# Patient Record
Sex: Female | Born: 1960 | Race: Black or African American | Hispanic: No | Marital: Married | Smoking: Never smoker
Health system: Southern US, Community
[De-identification: ages and names within clinical notes are randomized; demographics above are authoritative.]

## PROBLEM LIST (undated history)

## (undated) DIAGNOSIS — I10 Essential (primary) hypertension: Secondary | ICD-10-CM

## (undated) DIAGNOSIS — D509 Iron deficiency anemia, unspecified: Secondary | ICD-10-CM

## (undated) DIAGNOSIS — N921 Excessive and frequent menstruation with irregular cycle: Secondary | ICD-10-CM

## (undated) DIAGNOSIS — E039 Hypothyroidism, unspecified: Secondary | ICD-10-CM

## (undated) DIAGNOSIS — D259 Leiomyoma of uterus, unspecified: Secondary | ICD-10-CM

## (undated) HISTORY — DX: Hypothyroidism, unspecified: E03.9

## (undated) HISTORY — DX: Iron deficiency anemia, unspecified: D50.9

## (undated) HISTORY — DX: Essential (primary) hypertension: I10

## (undated) HISTORY — DX: Leiomyoma of uterus, unspecified: D25.9

## (undated) HISTORY — PX: OTHER SURGICAL HISTORY: SHX169

## (undated) HISTORY — DX: Excessive and frequent menstruation with irregular cycle: N92.1

---

## 1999-10-28 ENCOUNTER — Emergency Department (HOSPITAL_COMMUNITY): Admission: EM | Admit: 1999-10-28 | Discharge: 1999-10-29 | Payer: Self-pay | Admitting: Emergency Medicine

## 2001-05-31 ENCOUNTER — Encounter: Payer: Self-pay | Admitting: Family Medicine

## 2001-05-31 ENCOUNTER — Encounter: Admission: RE | Admit: 2001-05-31 | Discharge: 2001-05-31 | Payer: Self-pay | Admitting: Family Medicine

## 2001-08-12 ENCOUNTER — Ambulatory Visit (HOSPITAL_COMMUNITY): Admission: AD | Admit: 2001-08-12 | Discharge: 2001-08-13 | Payer: Self-pay | Admitting: *Deleted

## 2001-08-12 ENCOUNTER — Encounter (INDEPENDENT_AMBULATORY_CARE_PROVIDER_SITE_OTHER): Payer: Self-pay | Admitting: *Deleted

## 2003-02-13 ENCOUNTER — Emergency Department (HOSPITAL_COMMUNITY): Admission: EM | Admit: 2003-02-13 | Discharge: 2003-02-13 | Payer: Self-pay | Admitting: Emergency Medicine

## 2004-02-13 HISTORY — PX: THYROIDECTOMY, PARTIAL: SHX18

## 2005-10-21 ENCOUNTER — Encounter: Admission: RE | Admit: 2005-10-21 | Discharge: 2005-10-21 | Payer: Self-pay | Admitting: Emergency Medicine

## 2005-10-29 ENCOUNTER — Encounter: Admission: RE | Admit: 2005-10-29 | Discharge: 2005-10-29 | Payer: Self-pay | Admitting: Family Medicine

## 2007-02-22 ENCOUNTER — Emergency Department (HOSPITAL_COMMUNITY): Admission: EM | Admit: 2007-02-22 | Discharge: 2007-02-22 | Payer: Self-pay | Admitting: Emergency Medicine

## 2007-03-21 IMAGING — US US ABDOMEN COMPLETE
1 series · 14 of 25 positions shown · non-contrast
Comparison: No available priors.

CLINICAL DATA: Abdominal and back pain.  Question gallstones.  
 ABDOMEN ULTRASOUND:
TECHNIQUE: Complete abdominal ultrasound examination was performed including evaluation of the liver, gallbladder, bile ducts, pancreas, kidneys, spleen, IVC, and abdominal aorta.

[Series 1: unknown · 0.21mm/px · 14 of 96 slices shown]
[im 1/96]
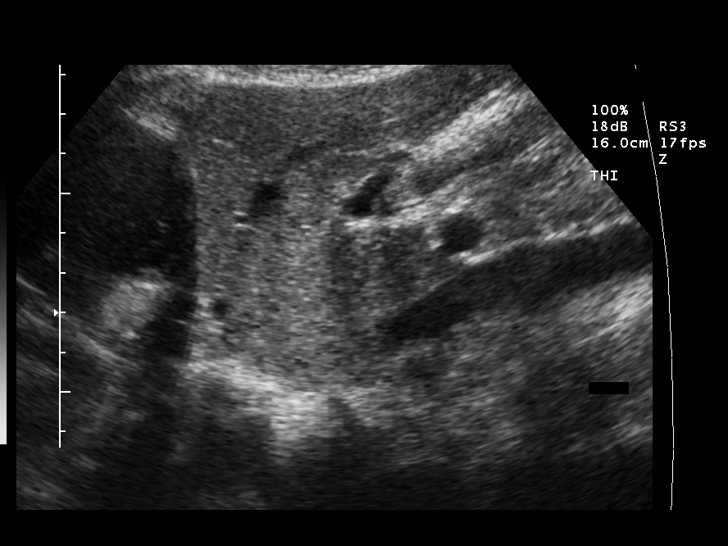
[im 8/96]
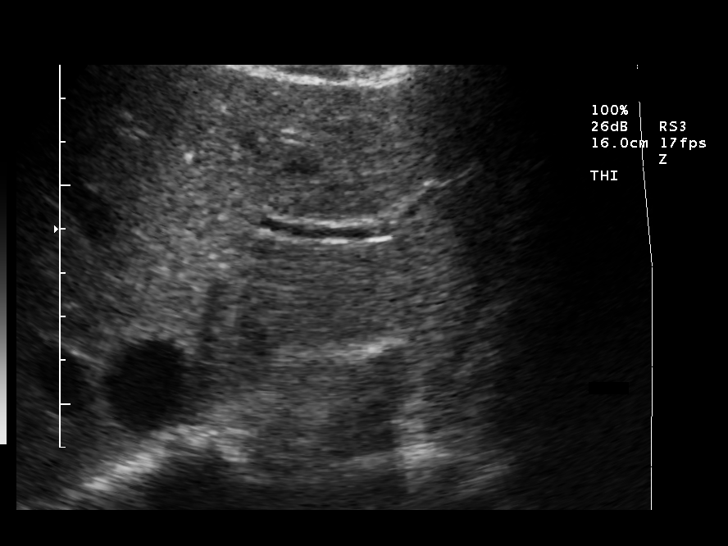
[im 16/96]
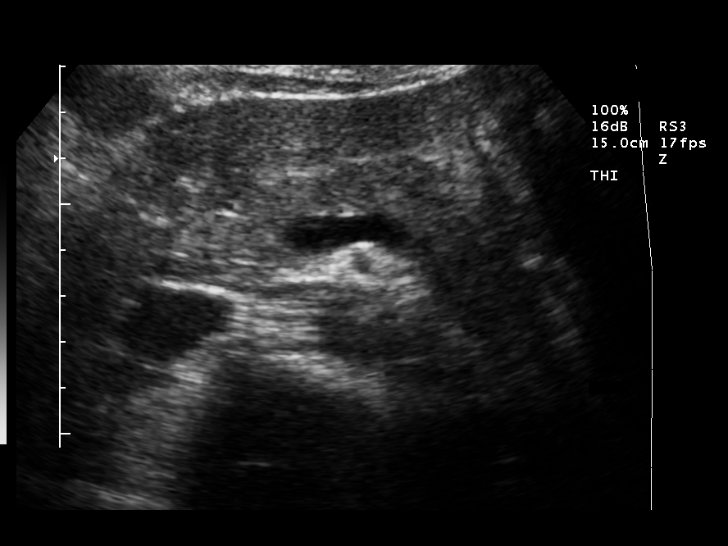
[im 24/96]
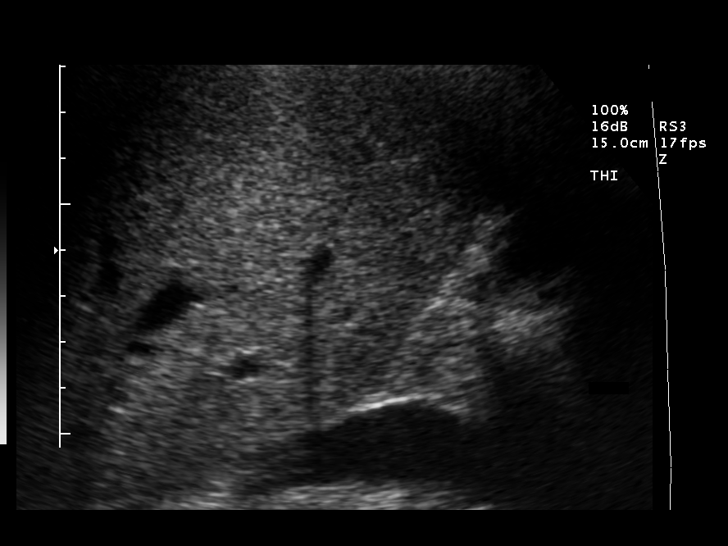
[im 32/96]
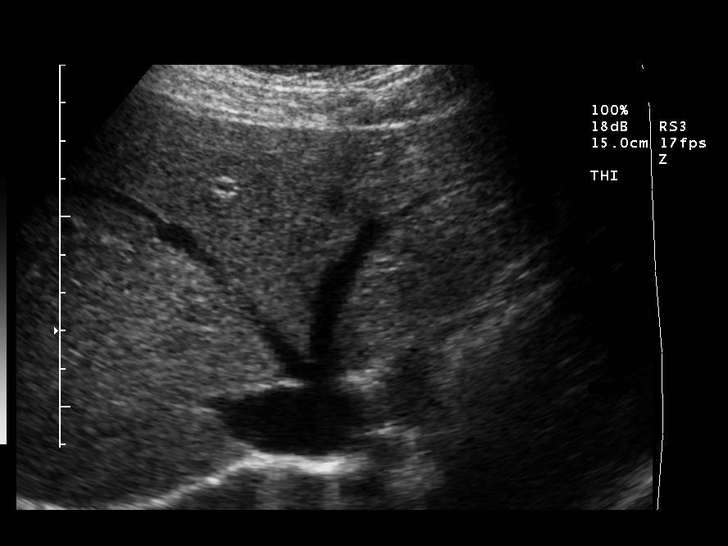
[im 36/96]
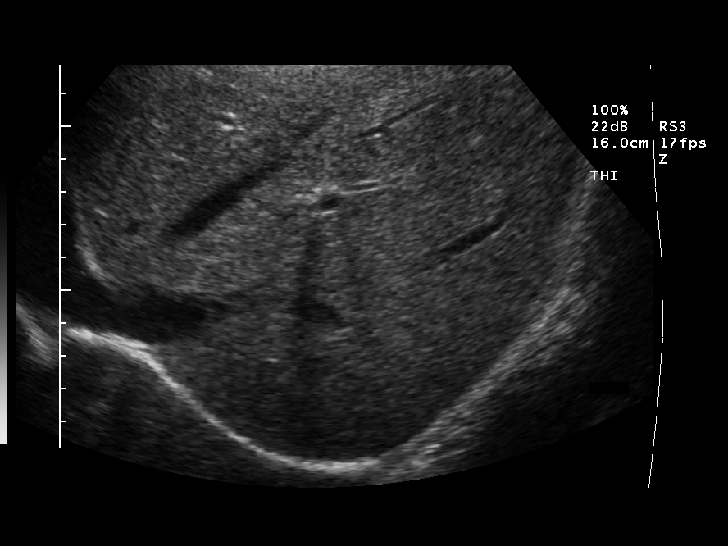
[im 44/96]
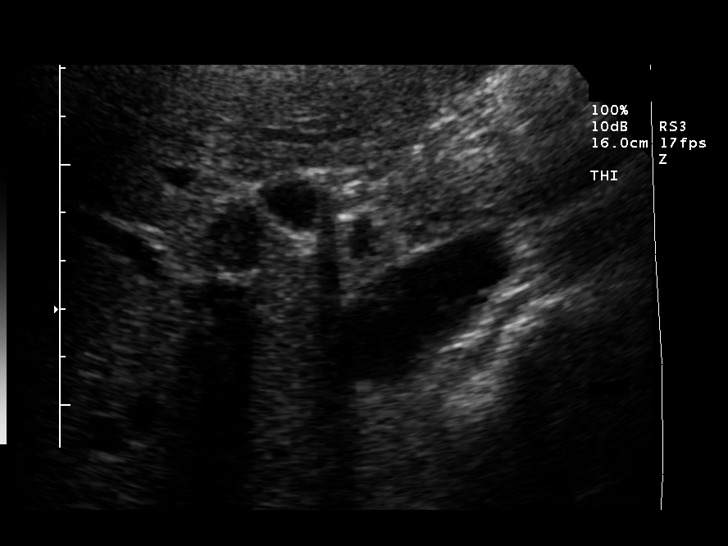
[im 52/96]
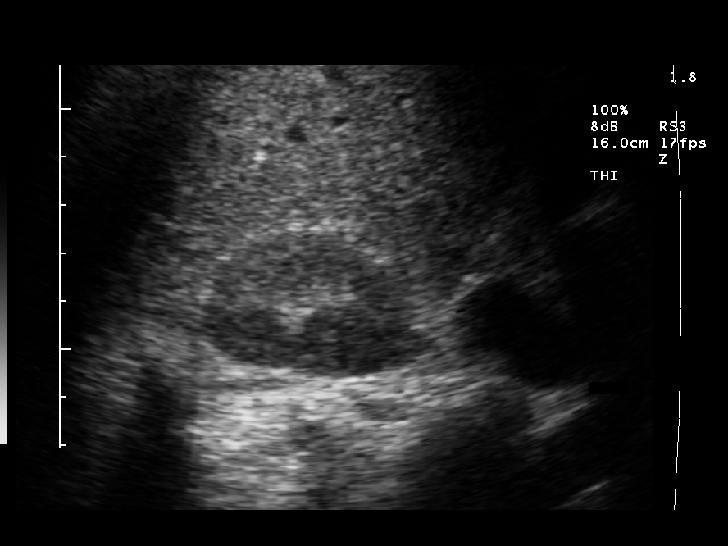
[im 60/96]
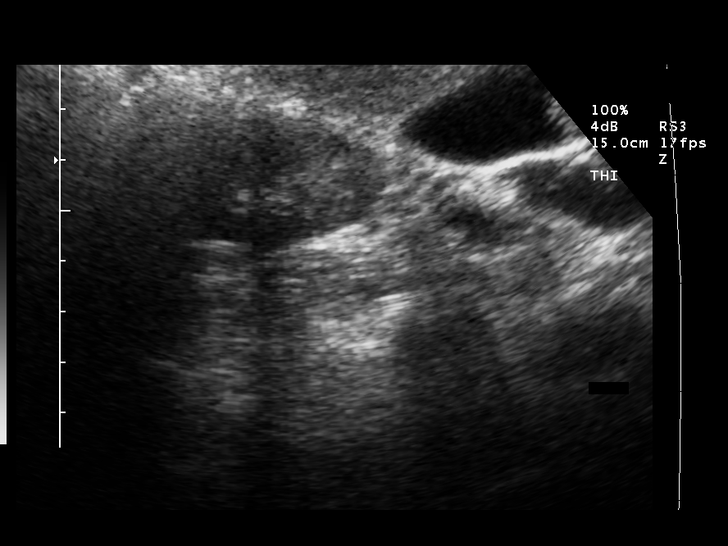
[im 64/96]
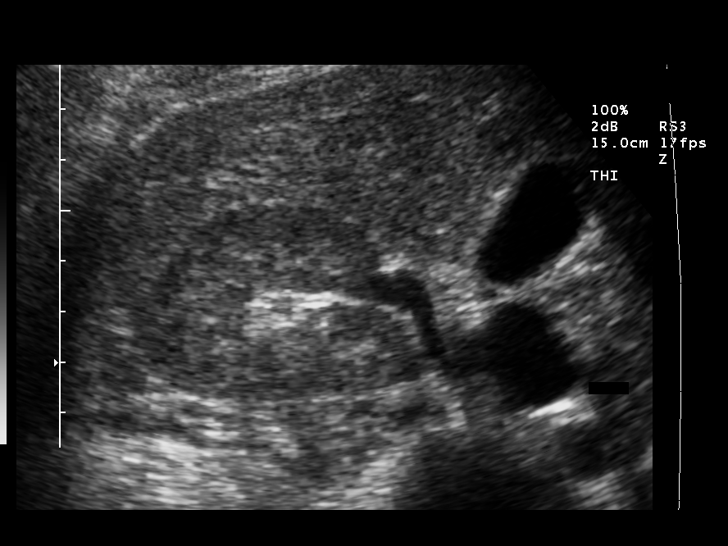
[im 72/96]
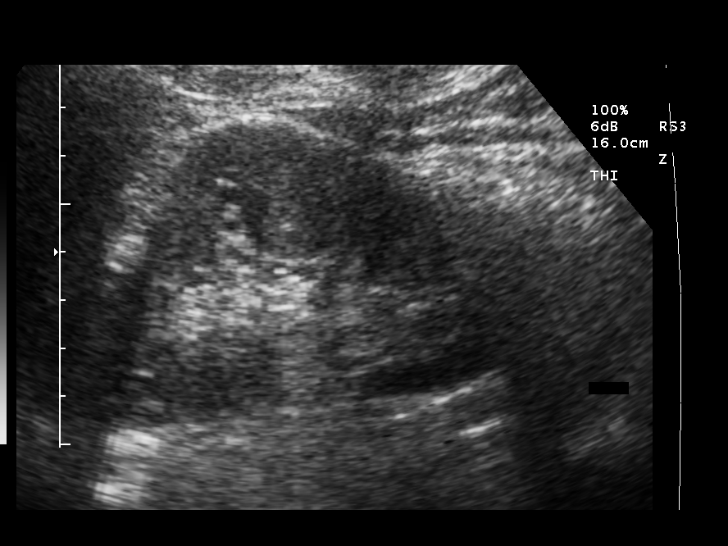
[im 80/96]
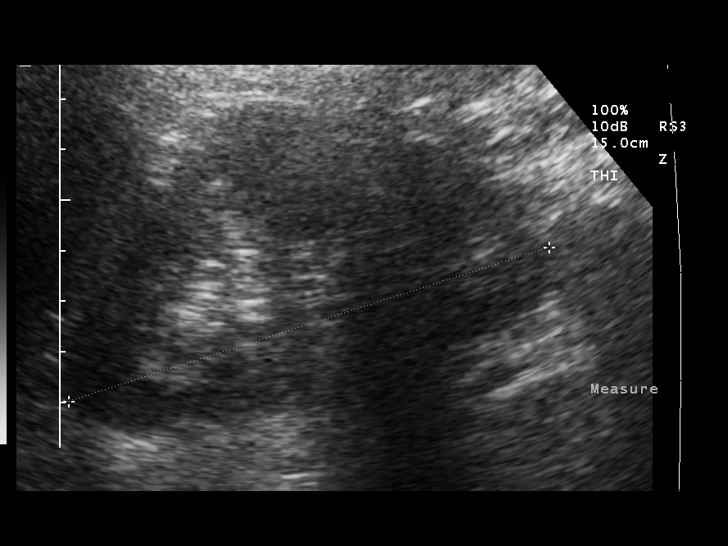
[im 88/96]
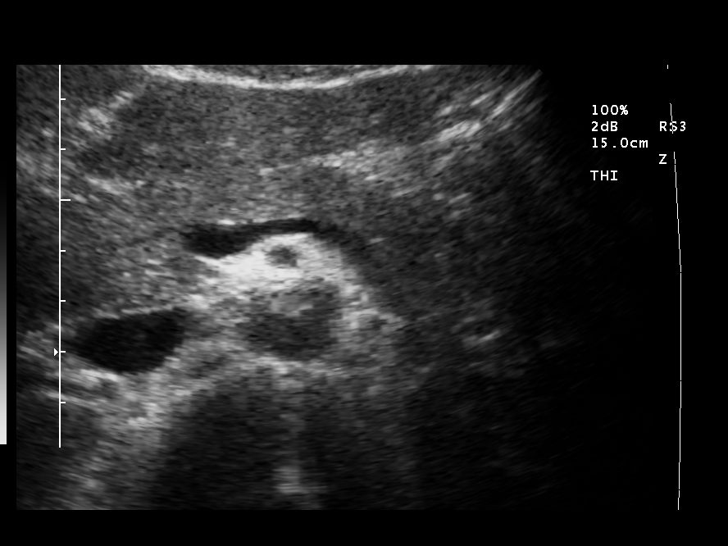
[im 96/96]
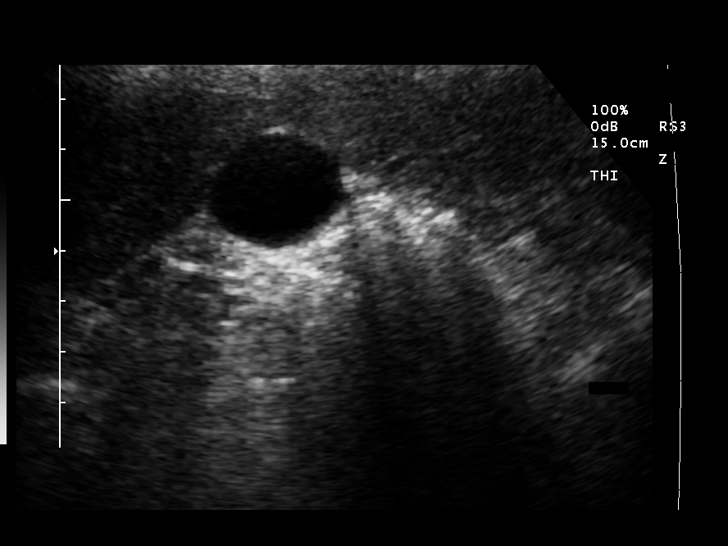

[14 of 25 positions shown; findings below may reference images not displayed]

FINDINGS: Liver is uniform in echogenicity without intra or extrahepatic biliary duct dilatation.  Extrahepatic bile duct measures 5 mm.  There is a 3 mm rounded lesion off of the wall of the gallbladder, likely representing a polyp.  No gallstones, sludge, wall thickening, pericholecystic fluid, or sonographic Murphy?s sign.  IVC, aorta, pancreas, spleen unremarkable.  Visualization of the kidneys is limited by bowel gas.  Note is made of tenderness over the right kidney during scanning.
IMPRESSION: Probable gallbladder polyp without gallstones or cholecystitis.

## 2008-06-18 ENCOUNTER — Emergency Department (HOSPITAL_COMMUNITY): Admission: EM | Admit: 2008-06-18 | Discharge: 2008-06-18 | Payer: Self-pay | Admitting: Emergency Medicine

## 2008-07-22 IMAGING — US US TRANSVAGINAL NON-OB
1 series · 14 of 25 positions shown · non-contrast
Comparison: none

CLINICAL DATA: Syncope.  
 TRANSABDOMINAL AND TRANSVAGINAL PELVIC ULTRASOUND ? 02/22/07:
TECHNIQUE: Both transabdominal and transvaginal ultrasound examinations of the pelvis were performed including evaluation of the uterus, ovaries, adnexal regions, and pelvic cul-de-sac. 
 No comparison.

[Series 1: unknown · 0.35mm/px · 14 of 80 slices shown]
[im 1/80]
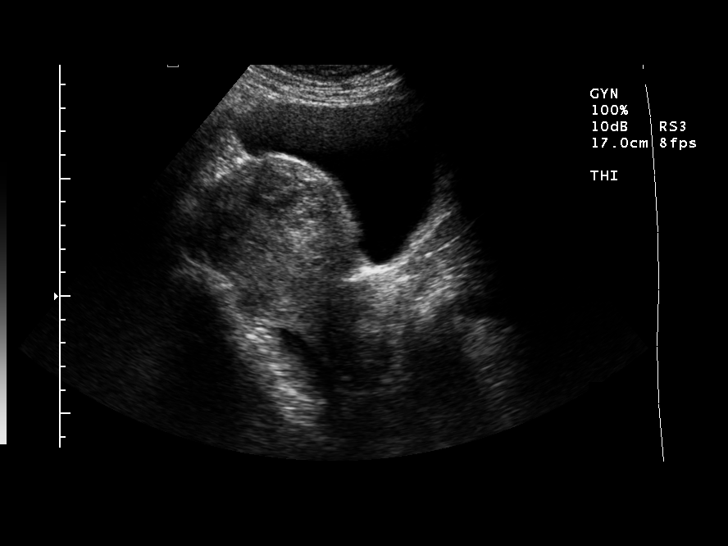
[im 7/80]
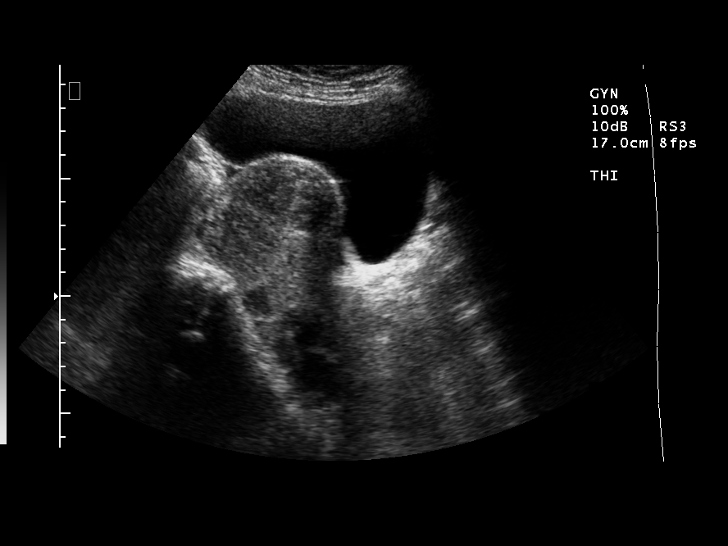
[im 14/80]
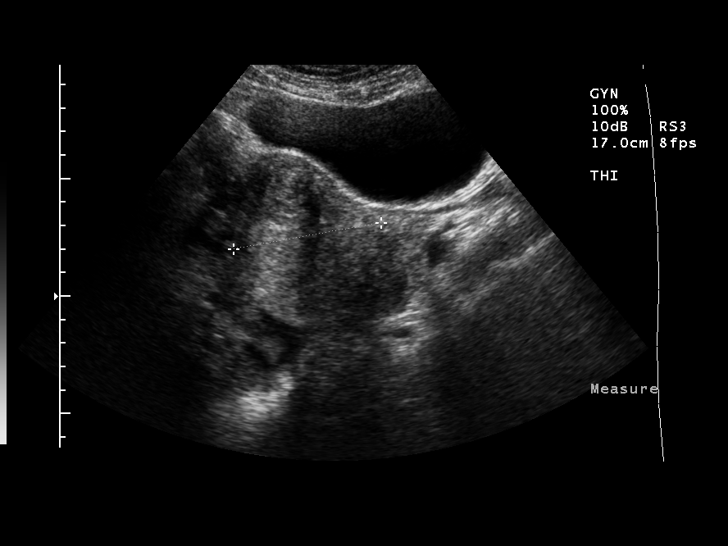
[im 20/80]
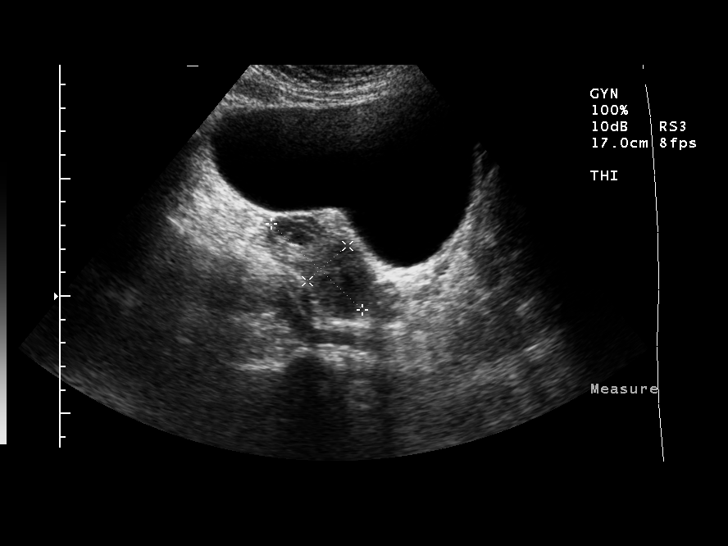
[im 27/80]
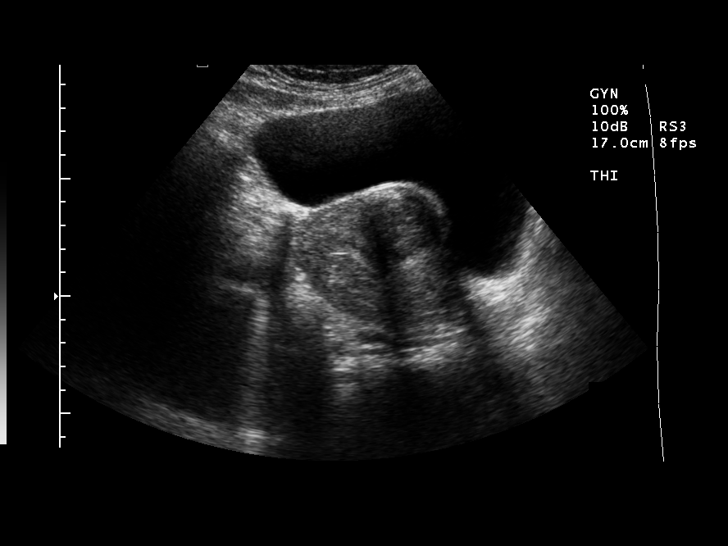
[im 30/80]
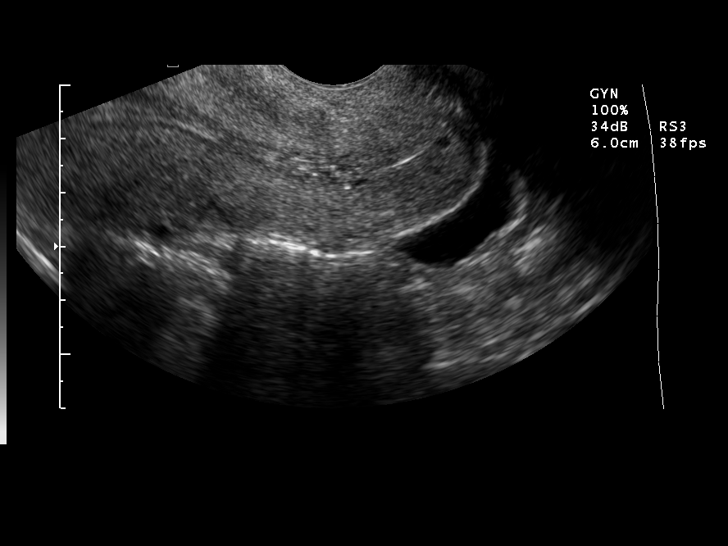
[im 37/80]
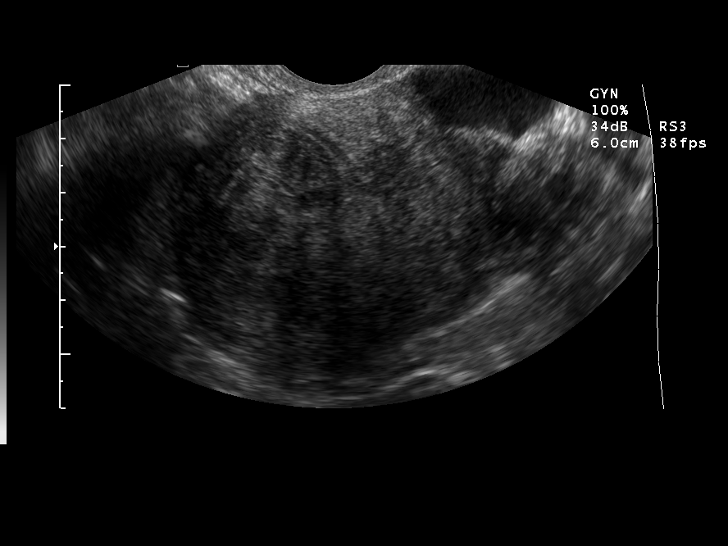
[im 43/80]
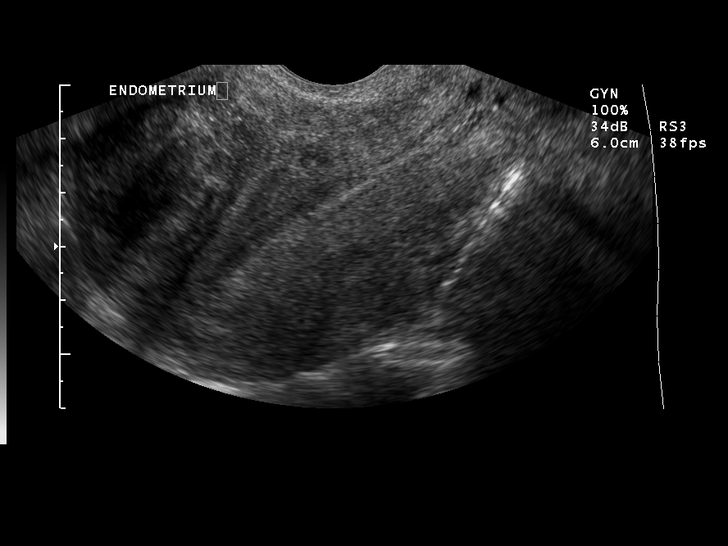
[im 50/80]
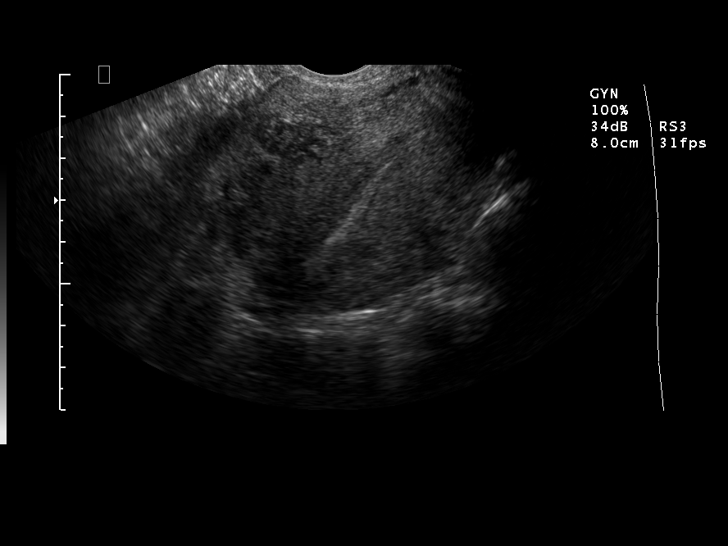
[im 53/80]
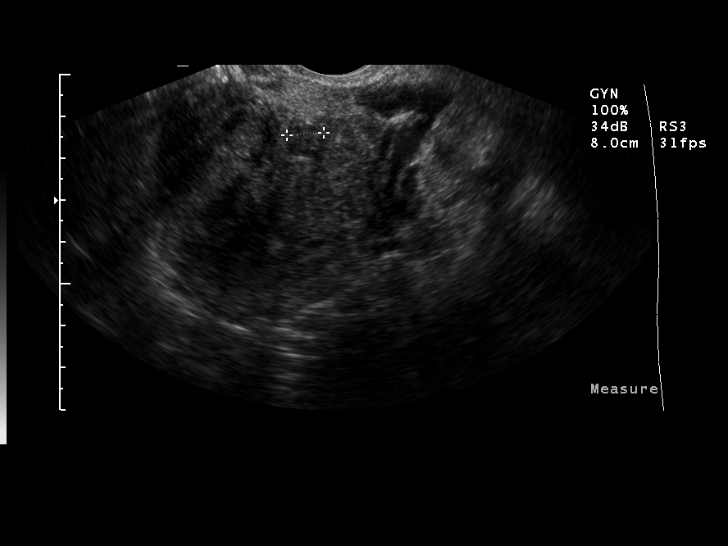
[im 60/80]
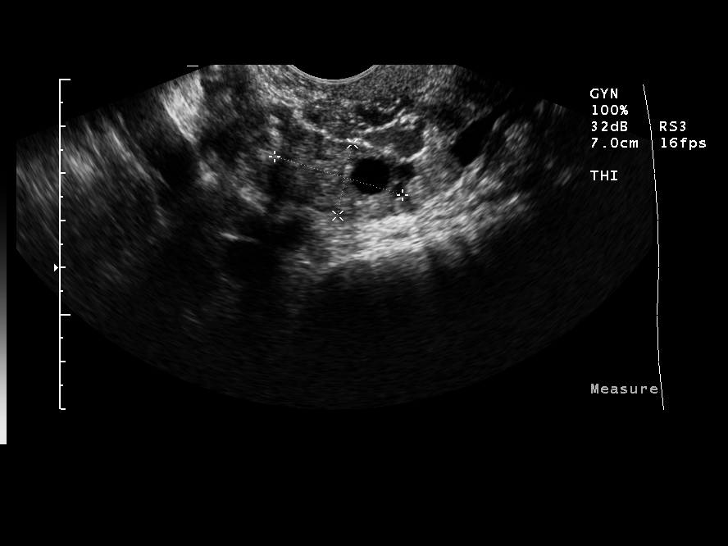
[im 66/80]
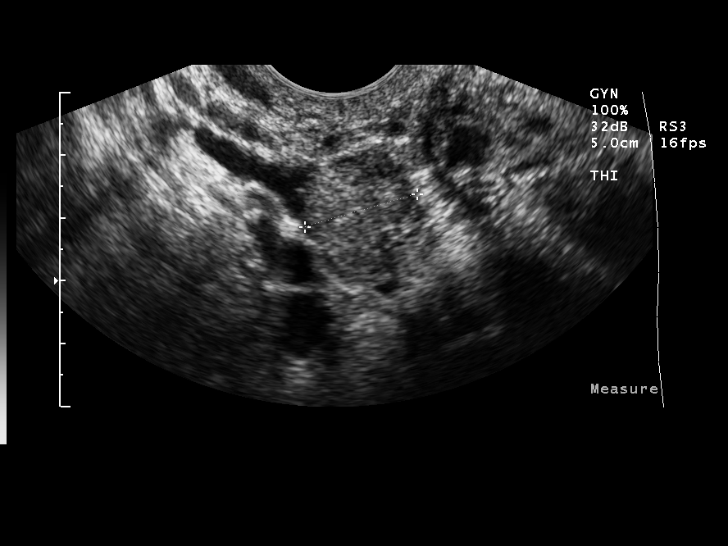
[im 73/80]
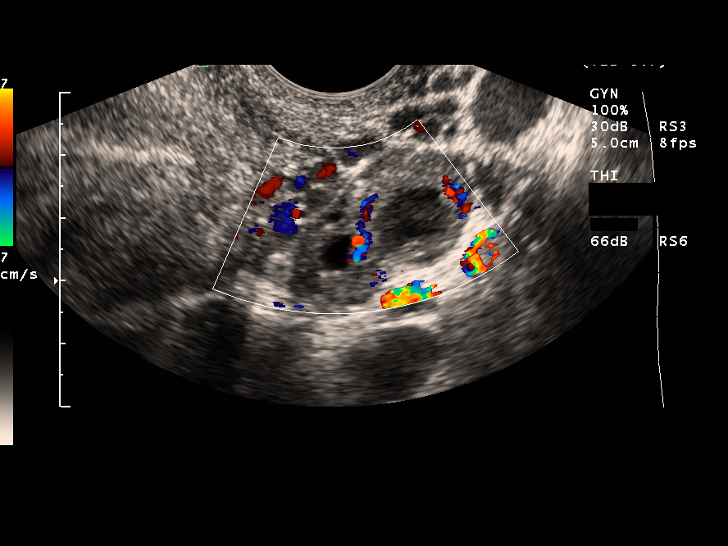
[im 80/80]
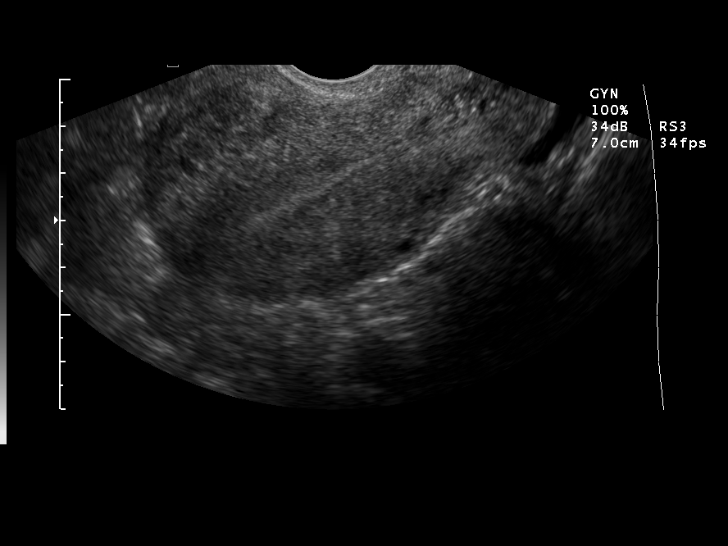

[14 of 25 positions shown; findings below may reference images not displayed]

FINDINGS: The uterus measures 10.2 x 6.7 x 6.2 cm.  There is a 3.4 x 3 cm anterior fundal fibroid and a smaller 16 x 9 mm fibroid in the anterior body of the uterus.  The endometrium measures 6 mm.  
 The right ovary measures 2.8 x 1.6 x 1.9 cm and contains a 9 mm follicle.  The left ovary measures 4.4 x 2.2 x 1.7 cm and contains a 2 cm cyst.  There is a small amount of free fluid.
IMPRESSION: Uterine fibroids.
 2 cm complex cyst on the left ovary with a small amount of free fluid.

## 2009-05-04 ENCOUNTER — Emergency Department (HOSPITAL_COMMUNITY): Admission: EM | Admit: 2009-05-04 | Discharge: 2009-05-04 | Payer: Self-pay | Admitting: Emergency Medicine

## 2010-03-03 ENCOUNTER — Emergency Department (HOSPITAL_COMMUNITY): Admission: EM | Admit: 2010-03-03 | Discharge: 2010-03-03 | Payer: Self-pay | Admitting: Family Medicine

## 2010-09-21 ENCOUNTER — Encounter: Payer: Self-pay | Admitting: Obstetrics and Gynecology

## 2010-10-02 IMAGING — CR DG CHEST 2V
2 series · 2 of 2 positions shown · non-contrast
Comparison: None.

CLINICAL DATA: Right upper posterior chest pain.

CHEST - 2 VIEW

[w chest pa]
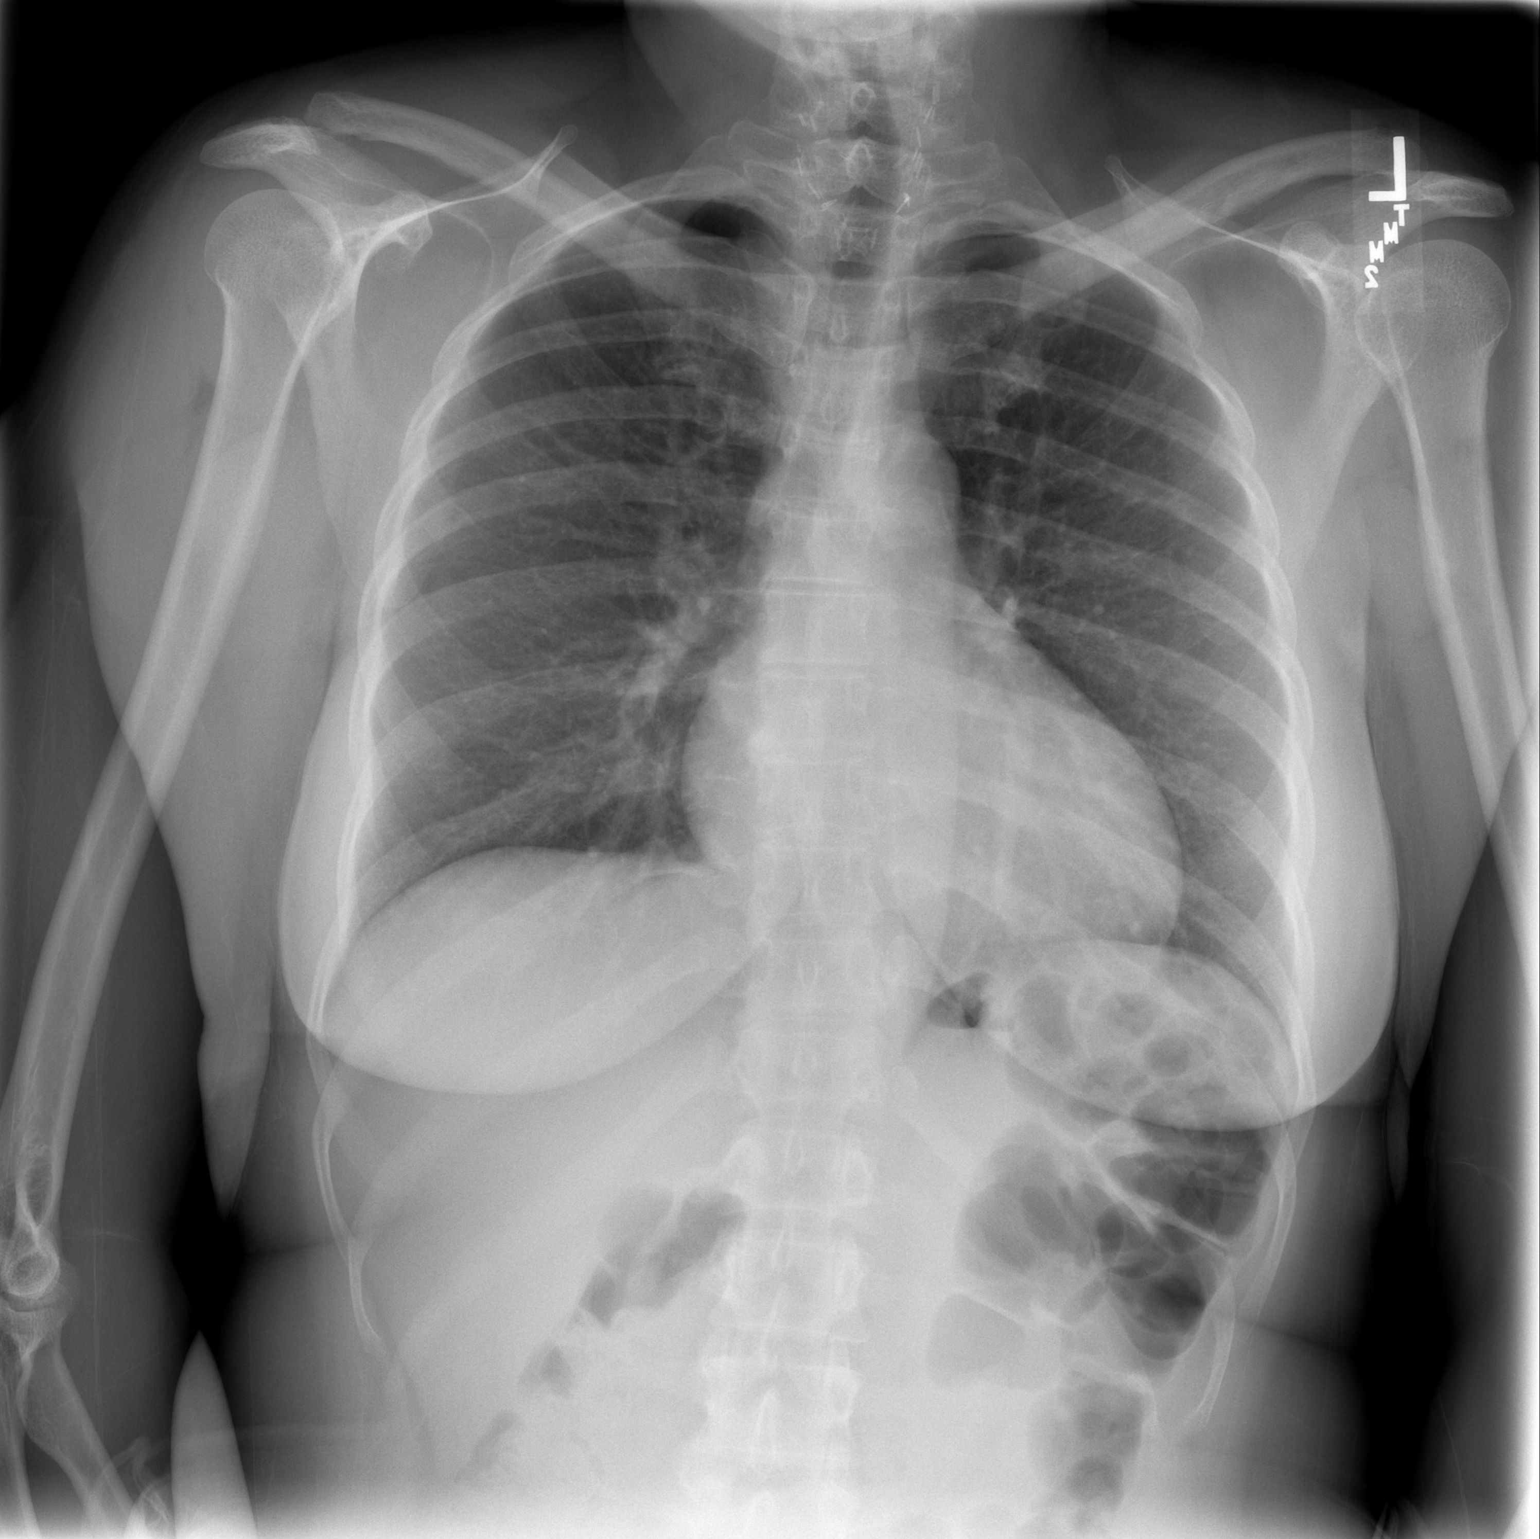

[w chest lat]
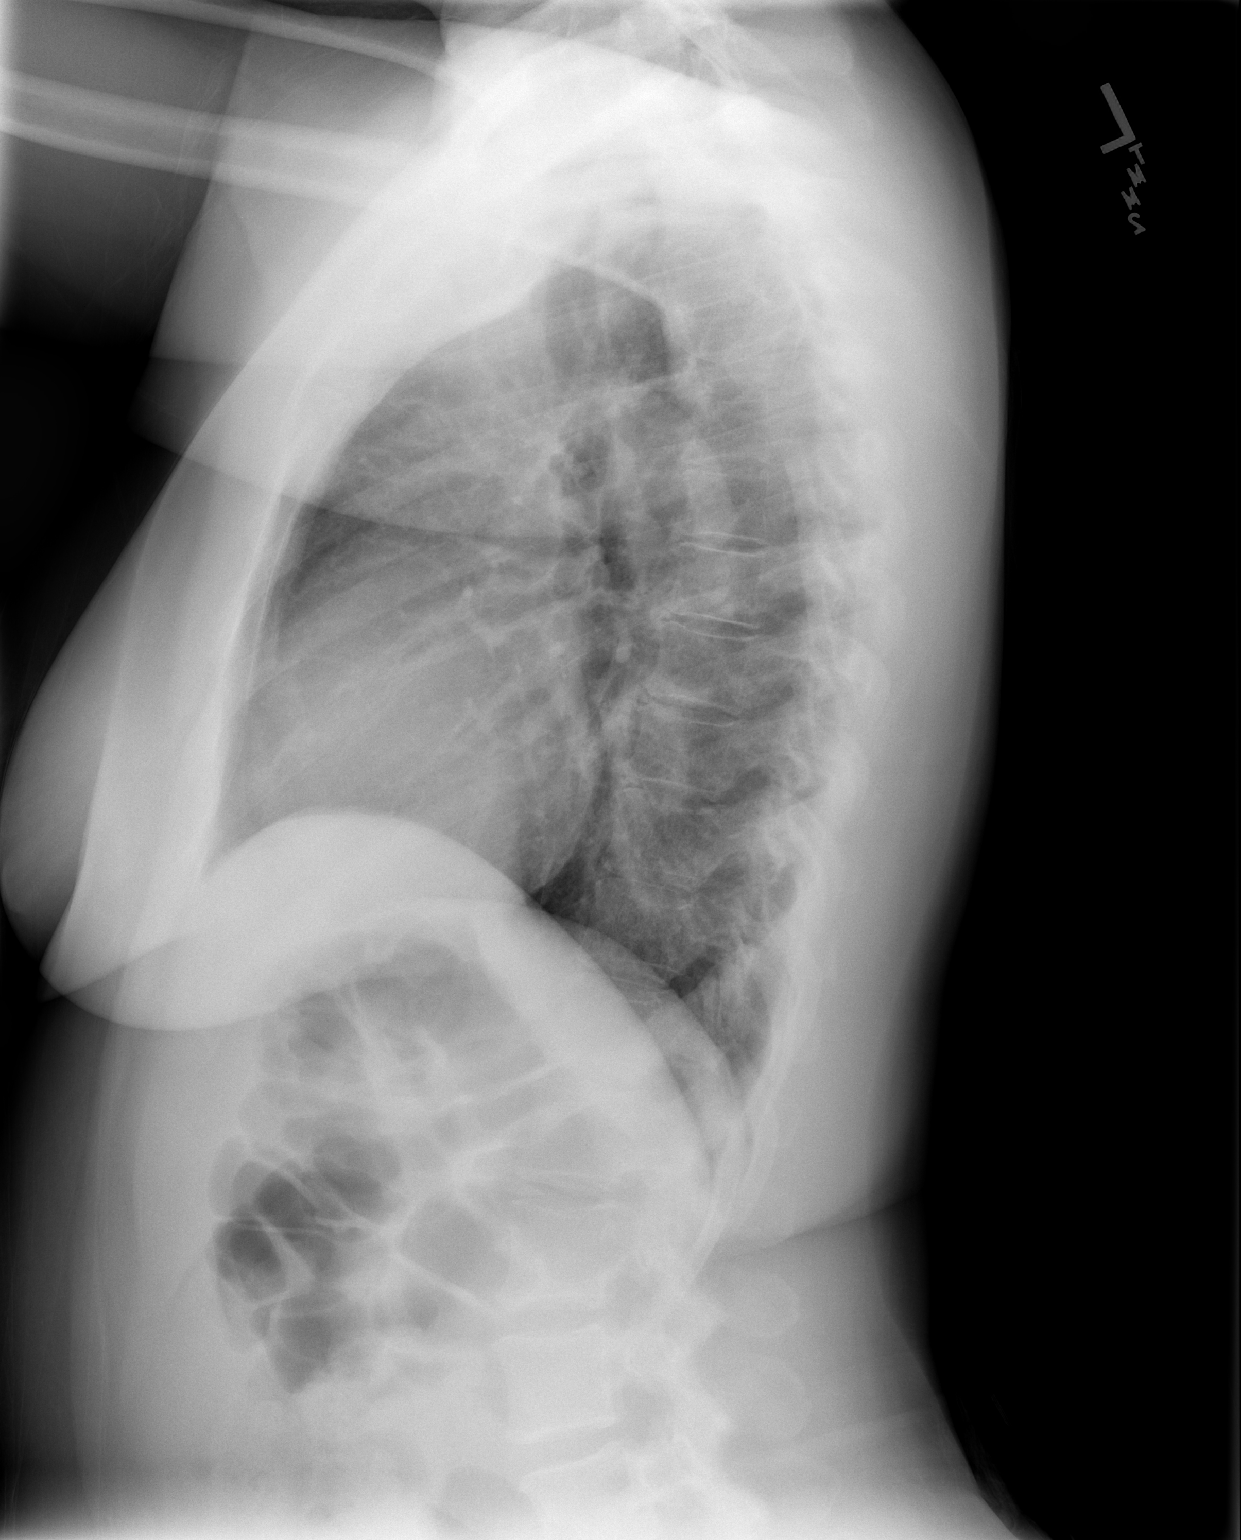

[2 of 2 positions shown; findings below may reference images not displayed]

FINDINGS: The lungs are clear without focal infiltrate, edema,
pneumothorax or pleural effusion. The cardiopericardial silhouette
is within normal limits for size. Imaged bony structures of the
thorax are intact. Surgical clips in the lower neck suggest prior
thyroidectomy.
IMPRESSION: No acute cardiopulmonary findings.

## 2010-11-16 LAB — POCT RAPID STREP A (OFFICE): Streptococcus, Group A Screen (Direct): NEGATIVE

## 2010-12-05 LAB — URINALYSIS, ROUTINE W REFLEX MICROSCOPIC
Glucose, UA: NEGATIVE mg/dL
Hgb urine dipstick: NEGATIVE
Ketones, ur: NEGATIVE mg/dL
Protein, ur: NEGATIVE mg/dL
Urobilinogen, UA: 0.2 mg/dL (ref 0.0–1.0)

## 2010-12-05 LAB — POCT PREGNANCY, URINE: Preg Test, Ur: NEGATIVE

## 2010-12-05 LAB — POCT I-STAT, CHEM 8
BUN: 13 mg/dL (ref 6–23)
Calcium, Ion: 1.22 mmol/L (ref 1.12–1.32)
Hemoglobin: 10.5 g/dL — ABNORMAL LOW (ref 12.0–15.0)
Sodium: 140 mEq/L (ref 135–145)
TCO2: 23 mmol/L (ref 0–100)

## 2010-12-05 LAB — URINE MICROSCOPIC-ADD ON

## 2011-01-16 NOTE — Op Note (Signed)
[facility]  Patient:    Monica Velazquez, Monica Velazquez Visit Number: [id number] MRN: [id number]          Service Type: DSU Location: [facility] 2859 [id number] Attending Physician:  Kandis Mannan Dictated by:   Donnie Coffin Samuella Cota, M.D. Proc. Date: 08/12/01 Admit Date:  08/10/2001 Discharge Date: 08/10/2001   CC:         Bernadene Person, M.D.  Doreatha Lew, M.D.   Operative Report  CCS NUMBER:  [id number]  PREOPERATIVE DIAGNOSIS:  Nontoxic nodular goiter.  POSTOPERATIVE DIAGNOSIS:  Nontoxic nodular goiter.  OPERATION:  Subtotal thyroidectomy (removal of entire left lobe isthmus and 90% of the right lobe of the thyroid).  SURGEON:  Maisie Fus B. Samuella Cota, M.D.  ASSISTANT:  Vikki Ports, M.D.  ANESTHESIA:  General.  ANESTHESIOLOGIST:  Burna Forts, M.D. and CRNA.  DESCRIPTION OF PROCEDURE:  The patient was taken to the operating room and placed on the table in the supine position.  After satisfactory general anesthetic with intubation, a sand bag was placed transversely beneath the shoulders and she was placed in the thyroid position, in a slight reverse Trendelenburg position.  The neck was prepped and draped in the sterile field. Using one of the creases in the patients neck, a transverse incision was made about 3 cm above the suprasternal notch.  The incision was taken through the skin and subcutaneous tissue and the platysma muscle.  Flaps were then developed superiorly and inferiorly beneath the platysmal muscle.  The Mahorner retractor was then placed into the wound.  The patient had a large goiter and the midline was divided longitudinally.  Dissection revealed that the patient had a large right lobe and an even larger left lobe.  Dissection of the right lobe was carried out first.  The inferior metal vein was clipped and divided.  The gland was rolled medially somewhat.  The superior pole vessels were identified and doubly ligated with 2-0 silk.   After the upper fold had been dissected, the gland rolled easier medially, and dissection revealed the recurrent laryngeal nerve fairly deep.  The inferior parathyroid on the left side was identified.  With the nerve being protected, about 90% of the right lobe of the thyroid was removed with bleeders being clipped with Hemoclips and ligated with 2-0 and 3-0 black silk.  A small button of thyroid tissue was left just anterior to the recurrent laryngeal nerve.  Part of this capsule was sutured to the pretracheal fascia using 4-0 Vicryl.  A specimen was sent down to the pathologist who reported no obvious evidence of malignancy.  The specimen that was removed did not to me show any evidence of parathyroid tissue.  Attention was then directed to the left side which was quite larger.  The superior thyroid arteries were doubly tied with 2-0 black silk and also a medium-sized Hemoclip was placed.  The gland was rolled medially and the recurrent laryngeal nerve was easily seen.  Protecting the recurrent laryngeal nerve, the entire gland was dissected free and removed.  No parathyroid tissue was seen on the specimen, but we did not definitely identify the inferior parathyroid on the left, but we thought we saw the the superior parathyroid on the left.  The wound was copiously irrigated.  Some Surgicel was placed in the bed on both sides.  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  It was felt that  no further surgery was indicated.  The nerve on both side had been seen and protected.  After irrigating the wound, the midline strap muscles were approximated with interrupted sutures of 4-0 Vicryl.  Platysmal muscle closed with 4-0 Vicryl and the skin was closed with vertical mattress sutures of 4-0 nylon, and _________ with Benzoin and 1/4 inch Steri-Strips.  A dry sterile dressing was applied.  Dr.  Jacklynn Bue then extubated the patient and looked at the cords, and both cords were moving.  The patient seemed to tolerate the procedure well, and was taken to the PACU in satisfactory condition. Dictated by:   Donnie Coffin Samuella Cota, M.D. Attending Physician:  Kandis Mannan DD:  08/12/01 TD:  08/13/01 Job: [id number]

## 2011-06-01 LAB — CBC
HCT: 28.4 — ABNORMAL LOW
MCHC: 31.7
MCV: 80.5
Platelets: 276
RDW: 16.6 — ABNORMAL HIGH

## 2011-06-01 LAB — POCT I-STAT, CHEM 8
Creatinine, Ser: 0.8
HCT: 30 — ABNORMAL LOW
Hemoglobin: 10.2 — ABNORMAL LOW
Sodium: 138
TCO2: 25

## 2011-06-01 LAB — URINE MICROSCOPIC-ADD ON

## 2011-06-01 LAB — URINALYSIS, ROUTINE W REFLEX MICROSCOPIC
Nitrite: NEGATIVE
Specific Gravity, Urine: 1.017
Urobilinogen, UA: 0.2
pH: 6.5

## 2011-06-01 LAB — DIFFERENTIAL
Basophils Absolute: 0
Basophils Relative: 0
Eosinophils Absolute: 0.2
Eosinophils Relative: 2

## 2011-06-17 LAB — CBC
HCT: 29.8 — ABNORMAL LOW
Platelets: 298
RDW: 17.2 — ABNORMAL HIGH

## 2011-06-17 LAB — I-STAT 8, (EC8 V) (CONVERTED LAB)
Bicarbonate: 26 — ABNORMAL HIGH
Glucose, Bld: 87
Sodium: 138
TCO2: 27
pH, Ven: 7.332 — ABNORMAL HIGH

## 2011-06-17 LAB — GC/CHLAMYDIA PROBE AMP, GENITAL
Chlamydia, DNA Probe: NEGATIVE
GC Probe Amp, Genital: NEGATIVE

## 2011-06-17 LAB — WET PREP, GENITAL: Trich, Wet Prep: NONE SEEN

## 2011-06-17 LAB — URINALYSIS, ROUTINE W REFLEX MICROSCOPIC
Bilirubin Urine: NEGATIVE
Glucose, UA: NEGATIVE
Hgb urine dipstick: NEGATIVE
Ketones, ur: NEGATIVE
pH: 6.5

## 2011-06-17 LAB — POCT PREGNANCY, URINE: Operator id: 146091

## 2011-06-17 LAB — DIFFERENTIAL
Basophils Absolute: 0
Eosinophils Absolute: 0.1
Eosinophils Relative: 1
Lymphocytes Relative: 13

## 2011-06-17 LAB — POCT I-STAT CREATININE: Operator id: 146091

## 2012-04-08 ENCOUNTER — Other Ambulatory Visit: Payer: Self-pay | Admitting: Family Medicine

## 2012-04-08 DIAGNOSIS — Z1231 Encounter for screening mammogram for malignant neoplasm of breast: Secondary | ICD-10-CM

## 2012-04-27 ENCOUNTER — Ambulatory Visit: Payer: Self-pay

## 2012-04-29 ENCOUNTER — Telehealth: Payer: Self-pay | Admitting: Oncology

## 2012-04-29 NOTE — Telephone Encounter (Signed)
S/W pt in re NP appt 9/6 @ 12 w/Dr. Gaylyn Rong Referring Dr. Loreta Ave Dx- IDA- HGB 7.9 NP packey mailed out.

## 2012-05-05 ENCOUNTER — Encounter: Payer: Self-pay | Admitting: Oncology

## 2012-05-05 DIAGNOSIS — D509 Iron deficiency anemia, unspecified: Secondary | ICD-10-CM | POA: Insufficient documentation

## 2012-05-06 ENCOUNTER — Ambulatory Visit (HOSPITAL_BASED_OUTPATIENT_CLINIC_OR_DEPARTMENT_OTHER): Payer: PRIVATE HEALTH INSURANCE

## 2012-05-06 ENCOUNTER — Telehealth: Payer: Self-pay | Admitting: Oncology

## 2012-05-06 ENCOUNTER — Other Ambulatory Visit (HOSPITAL_BASED_OUTPATIENT_CLINIC_OR_DEPARTMENT_OTHER): Payer: PRIVATE HEALTH INSURANCE

## 2012-05-06 ENCOUNTER — Ambulatory Visit (HOSPITAL_BASED_OUTPATIENT_CLINIC_OR_DEPARTMENT_OTHER): Payer: PRIVATE HEALTH INSURANCE | Admitting: Oncology

## 2012-05-06 ENCOUNTER — Encounter: Payer: Self-pay | Admitting: Oncology

## 2012-05-06 ENCOUNTER — Ambulatory Visit: Payer: PRIVATE HEALTH INSURANCE

## 2012-05-06 VITALS — BP 155/88 | HR 75 | Temp 98.2°F | Resp 20 | Ht 63.0 in | Wt 153.0 lb

## 2012-05-06 DIAGNOSIS — N920 Excessive and frequent menstruation with regular cycle: Secondary | ICD-10-CM

## 2012-05-06 DIAGNOSIS — D259 Leiomyoma of uterus, unspecified: Secondary | ICD-10-CM

## 2012-05-06 DIAGNOSIS — D509 Iron deficiency anemia, unspecified: Secondary | ICD-10-CM

## 2012-05-06 LAB — CBC & DIFF AND RETIC
EOS%: 2.7 % (ref 0.0–7.0)
Eosinophils Absolute: 0.2 10*3/uL (ref 0.0–0.5)
LYMPH%: 19.8 % (ref 14.0–49.7)
MCH: 21.3 pg — ABNORMAL LOW (ref 25.1–34.0)
MCV: 69.9 fL — ABNORMAL LOW (ref 79.5–101.0)
MONO%: 10.1 % (ref 0.0–14.0)
NEUT#: 3.7 10*3/uL (ref 1.5–6.5)
Platelets: 187 10*3/uL (ref 145–400)
RBC: 3.91 10*6/uL (ref 3.70–5.45)
Retic %: 2.6 % — ABNORMAL HIGH (ref 0.70–2.10)

## 2012-05-06 LAB — CHCC SMEAR

## 2012-05-06 LAB — COMPREHENSIVE METABOLIC PANEL (CC13)
ALT: 10 U/L (ref 0–55)
AST: 10 U/L (ref 5–34)
Albumin: 3.8 g/dL (ref 3.5–5.0)
Alkaline Phosphatase: 57 U/L (ref 40–150)
BUN: 15 mg/dL (ref 7.0–26.0)
Calcium: 9.7 mg/dL (ref 8.4–10.4)
Chloride: 104 mEq/L (ref 98–107)
Potassium: 3.9 mEq/L (ref 3.5–5.1)
Sodium: 138 mEq/L (ref 136–145)
Total Protein: 7 g/dL (ref 6.4–8.3)

## 2012-05-06 MED ORDER — SODIUM CHLORIDE 0.9 % IV SOLN
1020.0000 mg | Freq: Once | INTRAVENOUS | Status: AC
  Administered 2012-05-06: 1020 mg via INTRAVENOUS
  Filled 2012-05-06: qty 34

## 2012-05-06 NOTE — Patient Instructions (Addendum)
1.  Diagnosis:  Iron deficiency anemia. 2.  Potential causes:  From heavy menstrual bleeding.  3.  Recommendation:  IV iron (which normally lasts for 6-9 months). Continue to take oral iron with VitC or Orange juice to increase absorption if able to tolerate.  There are many different formulations of oral iron; however, NuIron and SlowFe tend to cause less stomach upset and constipation.  4.  Follow up:  Recheck blood count every 2 weeks until Hgb is better than 10.  Follow up in about 6 months.

## 2012-05-06 NOTE — Telephone Encounter (Signed)
gve the pt her sept,oct,nov, and dec 2013 appt calendar along with the march 2014 appt calendar

## 2012-05-06 NOTE — Progress Notes (Signed)
Checked in new pt.  No financial concerns at this time. °

## 2012-05-06 NOTE — Patient Instructions (Signed)
Ferumoxytol injection What is this medicine? FERUMOXYTOL is an iron complex. Iron is used to make healthy red blood cells, which carry oxygen and nutrients throughout the body. This medicine is used to treat iron deficiency anemia in people with chronic kidney disease. This medicine may be used for other purposes; ask your health care provider or pharmacist if you have questions. What should I tell my health care provider before I take this medicine? They need to know if you have any of these conditions: -anemia not caused by low iron levels -high levels of iron in the blood -magnetic resonance imaging (MRI) test scheduled -an unusual or allergic reaction to iron, other medicines, foods, dyes, or preservatives -pregnant or trying to get pregnant -breast-feeding How should I use this medicine? This medicine is for infusion into a vein. It is given by a health care professional in a hospital or clinic setting. Talk to your pediatrician regarding the use of this medicine in children. Special care may be needed. Overdosage: If you think you've taken too much of this medicine contact a poison control center or emergency room at once. Overdosage: If you think you have taken too much of this medicine contact a poison control center or emergency room at once. NOTE: This medicine is only for you. Do not share this medicine with others. What if I miss a dose? It is important not to miss your dose. Call your doctor or health care professional if you are unable to keep an appointment. What may interact with this medicine? This medicine may interact with the following medications: -other iron products This list may not describe all possible interactions. Give your health care provider a list of all the medicines, herbs, non-prescription drugs, or dietary supplements you use. Also tell them if you smoke, drink alcohol, or use illegal drugs. Some items may interact with your medicine. What should I watch  for while using this medicine? Visit your doctor or healthcare professional regularly. Tell your doctor or healthcare professional if your symptoms do not start to get better or if they get worse. You may need blood work done while you are taking this medicine. You may need to follow a special diet. Talk to your doctor. Foods that contain iron include: whole grains/cereals, dried fruits, beans, or peas, leafy green vegetables, and organ meats (liver, kidney). What side effects may I notice from receiving this medicine? Side effects that you should report to your doctor or health care professional as soon as possible: -allergic reactions like skin rash, itching or hives, swelling of the face, lips, or tongue -breathing problems -changes in blood pressure -feeling faint or lightheaded, falls -fever or chills -flushing, sweating, or hot feelings -swelling of the ankles or feet Side effects that usually do not require medical attention (Report these to your doctor or health care professional if they continue or are bothersome.): -diarrhea -headache -nausea, vomiting -stomach pain This list may not describe all possible side effects. Call your doctor for medical advice about side effects. You may report side effects to FDA at 1-800-FDA-1088. Where should I keep my medicine? This drug is given in a hospital or clinic and will not be stored at home. NOTE: This sheet is a summary. It may not cover all possible information. If you have questions about this medicine, talk to your doctor, pharmacist, or health care provider.  2012, [employer]vier/Gold Standard. (05/09/2008 9:48:25 PM) 

## 2012-05-07 ENCOUNTER — Encounter: Payer: Self-pay | Admitting: Oncology

## 2012-05-07 NOTE — Progress Notes (Signed)
[facility]  Telephone:([phone]) [phone] [phone]     INITIAL HEMATOLOGY CONSULTATION    Referral MD:  Dr. Charna Elizabeth, M.D.  Reason for Referral: iron deficiency anemia.     HPI: Monica Velazquez is a 51 year old woman with history of uterine fibroid, memometrorrhagia, chronic anemia for years per her report and confirmed by CBC in EPIC.  On 02/22/2007, her Hgb was 9.5.  She has not been compliant with taking oral iron due to constipation.  She turned 51; and presented to Dr. Loreta Ave for routine screening colonoscopy.  Her screening lab showed Hgb 7.9; iron 11; ferrin 2.  She was thus kindly referred to the [facility] for evaluation.   Monica Velazquez presented to the clinic by herself today.  She reports chronic fatigue; mood irritability, ice pica.  Her menstrual cycle is becoming irregular.  However, each time, her menstrual cycle lasts about 5 days; the first 2 days are heavy when she needs to changes every 2 hours.  She has dizziness, SOB, DOE. 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   Patient denies fever, anorexia, weight loss, fatigue, headache, visual changes, confusion, drenching night sweats, palpable lymph node swelling, mucositis, odynophagia, dysphagia, nausea vomiting, jaundice, chest pain, palpitation, productive cough, gum bleeding, epistaxis, hematemesis, hemoptysis, abdominal pain, abdominal swelling, early satiety, melena, hematochezia, hematuria, skin rash, spontaneous bleeding, joint swelling, joint pain, heat or cold intolerance, bowel bladder incontinence, focal motor weakness, paresthesia, depression.     Past Medical History  Diagnosis Date  . Iron deficiency anemia   . Uterine fibroid   . Menometrorrhagia   . HTN (hypertension)   . Hypothyroid   :    Past Surgical History  Procedure Date  . Right thyroidectomy   :   CURRENT MEDS: Current Outpatient  Prescriptions  Medication Sig Dispense Refill  . acetaminophen (TYLENOL) 325 MG tablet Take 650 mg by mouth every 6 (six) hours as needed.      Marland Kitchen levothyroxine (SYNTHROID, LEVOTHROID) 137 MCG tablet Take 137 mcg by mouth Daily.      Marland Kitchen lisinopril-hydrochlorothiazide (PRINZIDE,ZESTORETIC) 20-25 MG per tablet Take 20-25 mg by mouth Daily.      . pediatric multivitamin + iron (POLY-VI-SOL +IRON) 10 MG/ML oral solution Take 1 mL by mouth daily.       No current facility-administered medications for this visit.   Facility-Administered Medications Ordered in Other Visits  Medication Dose Route Frequency Provider Last Rate Last Dose  . ferumoxytol (FERAHEME) 1,020 mg in sodium chloride 0.9 % 100 mL IVPB  1,020 mg Intravenous Once Exie Parody, MD   1,020 mg at 05/06/12 1630      Allergies no known allergies:  Family History  Problem Relation Age of Onset  . Diabetes Mother   . Hypertension Mother   . Stroke Father   . Hypertension Sister   . Sickle cell trait Sister   . Hypertension Brother   . Diabetes Brother   . Hypertension Sister   . Sickle cell anemia Other   :  History   Social History  . Marital Status: Married    Spouse Name: N/A    Number of Children: 2  . Years of Education: N/A   Occupational History  .      nurse assistant at [employer]   Social History Main Topics  . Smoking status: Never Smoker   . Smokeless  tobacco: Never Used  . Alcohol Use: No  . Drug Use: No  . Sexually Active:    Other Topics Concern  . Not on file   Social History Narrative  . No narrative on file  :  REVIEW OF SYSTEM:  The rest of the 14-point review of sytem was negative.   Exam: ECOG 0-1  General:  well-nourished woman, in no acute distress.  Eyes:  no scleral icterus.  ENT:  There were no oropharyngeal lesions.  Neck was without thyromegaly.  Lymphatics:  Negative cervical, supraclavicular or axillary adenopathy.  Respiratory: lungs were clear bilaterally without  wheezing or crackles.  Cardiovascular:  Regular rate and rhythm, S1/S2, without murmur, rub or gallop.  There was no pedal edema.  GI:  abdomen was soft, flat, nontender, nondistended, without organomegaly.  She deferred rectal exam since she will have a colonoscopy in 05/2012.  Muscoloskeletal:  no spinal tenderness of palpation of vertebral spine.  Skin exam was without echymosis, petichae.  Neuro exam was nonfocal.  Patient was able to get on and off exam table without assistance.  Gait was normal.  Patient was alerted and oriented.  Attention was good.   Language was appropriate.  Mood was normal without depression.  Speech was not pressured.  Thought content was not tangential.    LABS:  Lab Results  Component Value Date   WBC 5.6 05/06/2012   HGB 8.3* 05/06/2012   HCT 27.3* 05/06/2012   PLT 187 05/06/2012   GLUCOSE 80 05/06/2012   ALT 10 05/06/2012   AST 10 05/06/2012   NA 138 05/06/2012   K 3.9 05/06/2012   CL 104 05/06/2012   CREATININE 0.7 05/06/2012   BUN 15.0 05/06/2012   CO2 24 05/06/2012    Blood smear review:   I personally reviewed the patient's peripheral blood smear today.  There was anisocytosis.  There was marked increased in central pallor.  There was only mild polychromasia.  There was no peripheral blast.  There was no schistocytosis, sickle cells, spherocytosis, target cell, rouleaux formation, tear drop cell.  There was no giant platelets or platelet clumps.     ASSESSMENT AND PLAN:   1.  Menometrorrhagia:  She will see her Gyn soon.  2.  Symptomatic microcytic anemia: - Differential:  Most likely iron deficiency anemia from chronic blood loss (elevated RDW, low MCV, low iron/ferritin).  Cannot rule out additional component of hemoglobinopathy (sickle cell trait) given family history.  However, hemoglobin electrophoresis is not accurate with concurrent severe iron deficiency. Lab ruled out hemolysis.  With elevated retic, primary bone marrow failure such as pure red cell aplasia is  unlikely. - Treatment:  IV iron Feraheme.  I advised patient of the pros (improving Hgb, decreasing fatigue); and cons (potential myalgia, arthralgia, infusion reaction).  She expressed informed understanding and wished to proceed.  Her Hgb is >7; there is no strong indication for pRBC transfusion.  I advised her to try other formulations of oral iron such as NuIron, SlowFe along with stool softener to avoid cosntipation.  3.  Follow up:  Recheck blood count every 2 weeks [facility] at the [facility] until Hgb is more than 10.  Follow up in about 6 months.     Thank you for this referral.    The length of time of the encounter was 45 minutes. More than 50% of time was spent counseling and coordination of care.

## 2012-05-12 ENCOUNTER — Ambulatory Visit: Payer: Self-pay

## 2012-05-20 ENCOUNTER — Telehealth: Payer: Self-pay | Admitting: *Deleted

## 2012-05-20 ENCOUNTER — Other Ambulatory Visit: Payer: PRIVATE HEALTH INSURANCE | Admitting: Lab

## 2012-05-20 ENCOUNTER — Other Ambulatory Visit (HOSPITAL_BASED_OUTPATIENT_CLINIC_OR_DEPARTMENT_OTHER): Payer: PRIVATE HEALTH INSURANCE | Admitting: Lab

## 2012-05-20 DIAGNOSIS — D509 Iron deficiency anemia, unspecified: Secondary | ICD-10-CM

## 2012-05-20 LAB — CBC WITH DIFFERENTIAL/PLATELET
BASO%: 0.6 % (ref 0.0–2.0)
Basophils Absolute: 0 10*3/uL (ref 0.0–0.1)
EOS%: 1.7 % (ref 0.0–7.0)
HGB: 10.2 g/dL — ABNORMAL LOW (ref 11.6–15.9)
MCH: 24.6 pg — ABNORMAL LOW (ref 25.1–34.0)
MCV: 78.2 fL — ABNORMAL LOW (ref 79.5–101.0)
MONO%: 8 % (ref 0.0–14.0)
RBC: 4.15 10*6/uL (ref 3.70–5.45)
RDW: 31.1 % — ABNORMAL HIGH (ref 11.2–14.5)
lymph#: 1.1 10*3/uL (ref 0.9–3.3)

## 2012-05-20 NOTE — Telephone Encounter (Signed)
Called pt w/ lab results, informed Hgb improved, IV iron worked per Dr. Gaylyn Rong.   Keep next lab appt in 2 weeks as scheduled and continue oral iron as tolerated.  Pt verbalized understanding,  States hasn't started the oral iron yet but will this weekend.

## 2012-05-20 NOTE — Telephone Encounter (Signed)
Message copied by Wende Mott on Fri May 20, 2012  4:54 PM ------      Message from: Jethro Bolus T      Created: Fri May 20, 2012  4:08 PM       Please call pt.  IV iron worked.  Her Hgb has improved.  Continue oral iron as tolerated.  Keep up coming appointments for labs for now.  Thanks.

## 2012-05-27 ENCOUNTER — Other Ambulatory Visit: Payer: PRIVATE HEALTH INSURANCE | Admitting: Lab

## 2012-06-03 ENCOUNTER — Other Ambulatory Visit: Payer: PRIVATE HEALTH INSURANCE | Admitting: Lab

## 2012-06-03 ENCOUNTER — Other Ambulatory Visit: Payer: PRIVATE HEALTH INSURANCE

## 2012-06-10 ENCOUNTER — Ambulatory Visit: Payer: Self-pay

## 2012-06-10 ENCOUNTER — Other Ambulatory Visit: Payer: PRIVATE HEALTH INSURANCE | Admitting: Lab

## 2012-06-14 ENCOUNTER — Telehealth: Payer: Self-pay | Admitting: Oncology

## 2012-06-14 NOTE — Telephone Encounter (Signed)
pt called to r/s 10/18 to 10/21,done,aware    aom

## 2012-06-17 ENCOUNTER — Other Ambulatory Visit: Payer: PRIVATE HEALTH INSURANCE | Admitting: Lab

## 2012-06-20 ENCOUNTER — Telehealth: Payer: Self-pay | Admitting: *Deleted

## 2012-06-20 ENCOUNTER — Other Ambulatory Visit (HOSPITAL_BASED_OUTPATIENT_CLINIC_OR_DEPARTMENT_OTHER): Payer: PRIVATE HEALTH INSURANCE | Admitting: Lab

## 2012-06-20 DIAGNOSIS — D509 Iron deficiency anemia, unspecified: Secondary | ICD-10-CM

## 2012-06-20 LAB — CBC WITH DIFFERENTIAL/PLATELET
Basophils Absolute: 0 10*3/uL (ref 0.0–0.1)
Eosinophils Absolute: 0.2 10*3/uL (ref 0.0–0.5)
HGB: 11.6 g/dL (ref 11.6–15.9)
MCV: 83.8 fL (ref 79.5–101.0)
MONO#: 0.7 10*3/uL (ref 0.1–0.9)
NEUT#: 6.3 10*3/uL (ref 1.5–6.5)
RBC: 4.25 10*6/uL (ref 3.70–5.45)
RDW: 28.4 % — ABNORMAL HIGH (ref 11.2–14.5)
WBC: 8.6 10*3/uL (ref 3.9–10.3)
lymph#: 1.4 10*3/uL (ref 0.9–3.3)

## 2012-06-20 NOTE — Telephone Encounter (Signed)
Called pt w/ lab results and relayed Dr. Lodema Pilot note below.  Informed next lab in 2 months, can cancel all other lab appts... Keep the appt on 12/13 already scheduled.   Pt states she has seen a gynecologist and scheduled to have a ultrasound in a few weeks.   POF sent to cancel lab appts, keep the one on 08/12/12.

## 2012-06-20 NOTE — Telephone Encounter (Signed)
Message copied by Wende Mott on Mon Jun 20, 2012  4:58 PM ------      Message from: Jethro Bolus T      Created: Mon Jun 20, 2012  4:16 PM       Please call pt.  Her iron-deficiency anemia (from heavy menstrual bleeding) has improved with IV iron.  She does not need every 4wk CBC check.  We can space out to every 2 months now.  Has she seen gynecologist yet?             Thanks.

## 2012-06-21 ENCOUNTER — Telehealth: Payer: Self-pay | Admitting: Oncology

## 2012-06-21 NOTE — Telephone Encounter (Signed)
Per pof i canceled all labs except 12.13.13.

## 2012-06-24 ENCOUNTER — Other Ambulatory Visit: Payer: PRIVATE HEALTH INSURANCE | Admitting: Lab

## 2012-07-01 ENCOUNTER — Other Ambulatory Visit: Payer: PRIVATE HEALTH INSURANCE | Admitting: Lab

## 2012-07-08 ENCOUNTER — Other Ambulatory Visit: Payer: PRIVATE HEALTH INSURANCE | Admitting: Lab

## 2012-07-15 ENCOUNTER — Other Ambulatory Visit: Payer: PRIVATE HEALTH INSURANCE | Admitting: Lab

## 2012-07-22 ENCOUNTER — Other Ambulatory Visit: Payer: PRIVATE HEALTH INSURANCE | Admitting: Lab

## 2012-07-29 ENCOUNTER — Other Ambulatory Visit: Payer: PRIVATE HEALTH INSURANCE | Admitting: Lab

## 2012-08-12 ENCOUNTER — Other Ambulatory Visit: Payer: PRIVATE HEALTH INSURANCE | Admitting: Lab

## 2012-08-26 ENCOUNTER — Other Ambulatory Visit: Payer: PRIVATE HEALTH INSURANCE | Admitting: Lab

## 2012-09-09 ENCOUNTER — Other Ambulatory Visit: Payer: PRIVATE HEALTH INSURANCE | Admitting: Lab

## 2012-09-23 ENCOUNTER — Other Ambulatory Visit: Payer: PRIVATE HEALTH INSURANCE | Admitting: Lab

## 2012-10-07 ENCOUNTER — Other Ambulatory Visit: Payer: PRIVATE HEALTH INSURANCE | Admitting: Lab

## 2012-11-04 ENCOUNTER — Encounter: Payer: PRIVATE HEALTH INSURANCE | Admitting: Oncology

## 2012-11-04 ENCOUNTER — Other Ambulatory Visit: Payer: PRIVATE HEALTH INSURANCE | Admitting: Lab

## 2012-11-04 NOTE — Progress Notes (Signed)
This encounter was created in error - please disregard.

## 2012-11-07 ENCOUNTER — Telehealth: Payer: Self-pay | Admitting: Oncology

## 2012-11-07 NOTE — Telephone Encounter (Signed)
lvm for pt regarding to r/s appt on 3.17.14

## 2012-11-14 ENCOUNTER — Encounter: Payer: PRIVATE HEALTH INSURANCE | Admitting: Oncology

## 2012-11-14 NOTE — Progress Notes (Signed)
This encounter was created in error - please disregard.

## 2012-11-16 ENCOUNTER — Telehealth: Payer: Self-pay | Admitting: Oncology

## 2012-11-16 NOTE — Telephone Encounter (Signed)
S/w pt re appt for 4/23.

## 2012-12-19 ENCOUNTER — Encounter: Payer: Self-pay | Admitting: Oncology

## 2012-12-21 ENCOUNTER — Other Ambulatory Visit: Payer: PRIVATE HEALTH INSURANCE | Admitting: Lab

## 2012-12-21 ENCOUNTER — Telehealth: Payer: Self-pay | Admitting: Oncology

## 2012-12-21 ENCOUNTER — Encounter: Payer: PRIVATE HEALTH INSURANCE | Admitting: Oncology

## 2012-12-21 NOTE — Progress Notes (Signed)
This encounter was created in error - please disregard.

## 2016-02-21 ENCOUNTER — Encounter (HOSPITAL_COMMUNITY): Payer: Self-pay | Admitting: Emergency Medicine

## 2016-02-21 ENCOUNTER — Emergency Department (HOSPITAL_COMMUNITY)
Admission: EM | Admit: 2016-02-21 | Discharge: 2016-02-21 | Disposition: A | Discharge status: Home / Self Care | Payer: Managed Care, Other (non HMO) | Attending: Emergency Medicine | Admitting: Emergency Medicine

## 2016-02-21 ENCOUNTER — Emergency Department (HOSPITAL_BASED_OUTPATIENT_CLINIC_OR_DEPARTMENT_OTHER)
Admit: 2016-02-21 | Discharge: 2016-02-21 | Disposition: A | Discharge status: Home / Self Care | Payer: Managed Care, Other (non HMO)

## 2016-02-21 DIAGNOSIS — M79605 Pain in left leg: Secondary | ICD-10-CM | POA: Diagnosis present

## 2016-02-21 DIAGNOSIS — Z79899 Other long term (current) drug therapy: Secondary | ICD-10-CM | POA: Insufficient documentation

## 2016-02-21 DIAGNOSIS — I10 Essential (primary) hypertension: Secondary | ICD-10-CM | POA: Insufficient documentation

## 2016-02-21 DIAGNOSIS — E039 Hypothyroidism, unspecified: Secondary | ICD-10-CM | POA: Diagnosis not present

## 2016-02-21 DIAGNOSIS — M7989 Other specified soft tissue disorders: Secondary | ICD-10-CM | POA: Diagnosis not present

## 2016-02-21 LAB — I-STAT CHEM 8, ED
BUN: 16 mg/dL (ref 6–20)
CHLORIDE: 102 mmol/L (ref 101–111)
CREATININE: 0.6 mg/dL (ref 0.44–1.00)
Calcium, Ion: 1.15 mmol/L (ref 1.12–1.23)
GLUCOSE: 91 mg/dL (ref 65–99)
HEMATOCRIT: 43 % (ref 36.0–46.0)
HEMOGLOBIN: 14.6 g/dL (ref 12.0–15.0)
POTASSIUM: 3.4 mmol/L — AB (ref 3.5–5.1)
Sodium: 142 mmol/L (ref 135–145)
TCO2: 30 mmol/L (ref 0–100)

## 2016-02-21 MED ORDER — NAPROXEN 500 MG PO TABS: 500.0000 mg | ORAL_TABLET | Freq: Two times a day (BID) | ORAL | Status: DC

## 2016-02-21 NOTE — ED Notes (Addendum)
Pt describes a "squeezing pain" that's radiating "up and down her leg." Denies swelling, redness, tenderness, excessive sitting recently, cough, SOB. Hx of back problems, and does say the pain goes from her back down her left leg and then it goes back up. Denies any pain near posterior calf, but states now it's at her anterior shin/knee area.

## 2016-02-21 NOTE — ED Provider Notes (Signed)
CSN: [id number]     Arrival date & time 02/21/16  1511 History  By signing my name below, I, Monica Velazquez, attest that this documentation has been prepared under the direction and in the presence of Waynetta Pean, PA-C. Electronically Signed: Judithann Velazquez, ED Scribe. 02/21/2016. 5:27 PM.    Chief Complaint  Patient presents with  . Leg Pain   The history is provided by the patient. No language interpreter was used.   HPI Comments: Monica Velazquez is a 55 y.o. female who presents to the Emergency Department complaining of sudden onset of gradually worsening throbbing, squeezing 8/10 pain to her left calf that radiates to her anterior lower leg and up toward her anterior left thigh onset several days ago.  She explains that bending the leg and sitting down makes the pain worse. No alleviating factors noted. Pt states that she tried her husband's prescription strength pain medication with no relief. Pt has a hx of chronic back pain but denies any increase pain since onset. She reports bilateral low back pain.  She denies any recent falls, injuries, or trauma. No recent surgeries, hospitalizations, long trips, or hormone use. She denies a hx of PE/DVT. She is not on endogenous estrogens. She denies any fever, loss of bowel or bladder control, edema, chest pain, rashes, SOB, n/v, numbness, tingling, or weakness.    Past Medical History  Diagnosis Date  . Iron deficiency anemia   . Uterine fibroid   . Menometrorrhagia   . HTN (hypertension)   . Hypothyroid    Past Surgical History  Procedure Laterality Date  . Right thyroidectomy     Family History  Problem Relation Age of Onset  . Diabetes Mother   . Hypertension Mother   . Stroke Father   . Hypertension Sister   . Sickle cell trait Sister   . Hypertension Brother   . Diabetes Brother   . Hypertension Sister   . Sickle cell anemia Other    Social History  Substance Use Topics  . Smoking status: Never Smoker   . Smokeless  tobacco: Never Used  . Alcohol Use: No   OB History    No data available     Review of Systems  Constitutional: Negative for fever and chills.  HENT: Negative for congestion.   Eyes: Negative for visual disturbance.  Respiratory: Negative for shortness of breath.   Cardiovascular: Negative for chest pain and leg swelling.  Gastrointestinal: Negative for nausea and vomiting.  Genitourinary: Negative for dysuria, urgency, frequency and difficulty urinating.  Musculoskeletal: Positive for myalgias, back pain and arthralgias. Negative for joint swelling.  Skin: Negative for color change, rash and wound.  Neurological: Negative for weakness and numbness.      Allergies  Review of patient's allergies indicates no known allergies.  Home Medications   Prior to Admission medications   Medication Sig Start Date End Date Taking? Authorizing Provider  Biotin 1 MG CAPS Take 1 capsule by mouth daily.   Yes Historical Provider, MD  HYDROcodone-acetaminophen (NORCO/VICODIN) 5-325 MG tablet Take 1 tablet by mouth once.   Yes Historical Provider, MD  levothyroxine (SYNTHROID, LEVOTHROID) 137 MCG tablet Take 137 mcg by mouth Daily. 04/06/12  Yes Historical Provider, MD  lisinopril-hydrochlorothiazide (PRINZIDE,ZESTORETIC) 20-25 MG per tablet Take 20-25 mg by mouth Daily. 04/05/12  Yes Historical Provider, MD  Multiple Vitamins-Minerals (MULTIVITAMIN ADULTS) TABS Take 1 tablet by mouth daily.   Yes Historical Provider, MD  naproxen (NAPROSYN) 500 MG tablet Take 1 tablet (500  mg total) by mouth 2 (two) times daily with a meal. 02/21/16   Waynetta Pean, PA-C   BP 174/93 mmHg  Pulse 80  Temp(Src) 98.4 F (36.9 C) (Oral)  Resp 16  SpO2 98% Physical Exam  Constitutional: She appears well-developed and well-nourished. No distress.  Nontoxic appearing.  HENT:  Head: Normocephalic and atraumatic.  Eyes: Conjunctivae are normal. Pupils are equal, round, and reactive to light. Right eye exhibits no  discharge. Left eye exhibits no discharge.  Cardiovascular: Normal rate, regular rhythm, normal heart sounds and intact distal pulses.   Bilateral dorsalis pedis and posterior tibialis pulses are intact. Good capillary refill to her bilateral distal toes.  Pulmonary/Chest: Effort normal and breath sounds normal. No respiratory distress. She has no wheezes. She has no rales.  Abdominal: Soft. There is no tenderness.  Musculoskeletal: Normal range of motion. She exhibits tenderness. She exhibits no edema.  Left calf tenderness without edema or overlying skin changes. No left foot or ankle TTP.  No right calf edema or tenderness Bilateral DP/PT pulses intact Good cap refill bilaterally distal to toes.  Good strength in her bilateral lower extremities.  Neurological: She is alert. She has normal reflexes. She displays normal reflexes. Coordination normal.  Bilateral patellar DTRs are intact. Sensation intact to bilateral lower extremities. Normal gait.  Skin: Skin is warm and dry. No rash noted. She is not diaphoretic. No erythema. No pallor.  Psychiatric: She has a normal mood and affect. Her behavior is normal.  Nursing note and vitals reviewed.   ED Course  Procedures (including critical care time) DIAGNOSTIC STUDIES: Oxygen Saturation is 98% on RA, normal by my interpretation.    COORDINATION OF CARE: 5:24 PM- Pt advised of plan for treatment and pt agrees. Pt will receive [place] and I-stat chem for further evaluation.    Labs Review Labs Reviewed  I-STAT CHEM 8, ED - Abnormal; Notable for the following:    Potassium 3.4 (*)    All other components within normal limits    Imaging Review No results found.   Waynetta Pean, PA-C has personally reviewed and evaluated these  lab results as part of his medical decision-making.   EKG Interpretation None      Filed Vitals:   02/21/16 1537 02/21/16 1755  BP: 176/92 174/93  Pulse: 84 80  Temp: 98.6 F (37 C) 98.4 F (36.9 C)   TempSrc: Oral Oral  Resp: 16 16  SpO2: 98% 98%     MDM   Meds given in ED:  Medications - No data to display  New Prescriptions   NAPROXEN (NAPROSYN) 500 MG TABLET    Take 1 tablet (500 mg total) by mouth 2 (two) times daily with a meal.    Final diagnoses:  Left leg pain   This is a 55 y.o. female who presents to the Emergency Department complaining of sudden onset of gradually worsening throbbing, squeezing 8/10 pain to her left calf that radiates to her anterior lower leg and up toward her anterior left thigh onset several days ago.  She explains that bending the leg and sitting down makes the pain worse. No alleviating factors noted. Pt states that she tried her husband's prescription strength pain medication with no relief. Pt has a hx of chronic back pain but denies any increase pain since onset. She reports bilateral low back pain.  She denies any recent falls, injuries, or trauma. No back pain red flags. No fevers. No edema.  No DVT or PE risk  factors.  On exam the patient is afebrile and nontoxic appearing. She has no calf edema. She does have tenderness to her left calf. No overlying skin changes. She is neurovascularly intact. DVT study ordered due to patient's calf tenderness which is unilateral. It was negative for DVT or Baker's cyst. I-STAT Chem-8 is unremarkable. I question if patient is having radicular pain from her back pain. Will start the patient on naproxen and have her follow-up closely with her primary care provider. I encouraged her to have her blood pressure rechecked. I advised the patient to follow-up with their primary care provider this week. I advised the patient to return to the emergency department with new or worsening symptoms or new concerns. The patient verbalized understanding and agreement with plan.    I personally performed the services described in this documentation, which was scribed in my presence. The recorded information has been reviewed and is  accurate.      Waynetta Pean, PA-C 02/21/16 1859  Daleen Bo, MD 02/22/16 [phone]

## 2016-02-21 NOTE — Progress Notes (Signed)
*  PRELIMINARY RESULTS* Vascular Ultrasound Left lower extremity venous duplex has been completed.  Preliminary findings: No evidence of DVT or baker's cyst.   Landry Mellow, RDMS, RVT  02/21/2016, 6:20 PM

## 2016-02-21 NOTE — Discharge Instructions (Signed)
Musculoskeletal Pain Musculoskeletal pain is muscle and boney aches and pains. These pains can occur in any part of the body. Your caregiver may treat you without knowing the cause of the pain. They may treat you if blood or urine tests, X-rays, and other tests were normal.  CAUSES There is often not a definite cause or reason for these pains. These pains may be caused by a type of germ (virus). The discomfort may also come from overuse. Overuse includes working out too hard when your body is not fit. Boney aches also come from weather changes. Bone is sensitive to atmospheric pressure changes. HOME CARE INSTRUCTIONS   Ask when your test results will be ready. Make sure you get your test results.  Only take over-the-counter or prescription medicines for pain, discomfort, or fever as directed by your caregiver. If you were given medications for your condition, do not drive, operate machinery or power tools, or sign legal documents for 24 hours. Do not drink alcohol. Do not take sleeping pills or other medications that may interfere with treatment.  Continue all activities unless the activities cause more pain. When the pain lessens, slowly resume normal activities. Gradually increase the intensity and duration of the activities or exercise.  During periods of severe pain, bed rest may be helpful. Lay or sit in any position that is comfortable.  Putting ice on the injured area.  Put ice in a bag.  Place a towel between your skin and the bag.  Leave the ice on for 15 to 20 minutes, 3 to 4 times a day.  Follow up with your caregiver for continued problems and no reason can be found for the pain. If the pain becomes worse or does not go away, it may be necessary to repeat tests or do additional testing. Your caregiver may need to look further for a possible cause. SEEK IMMEDIATE MEDICAL CARE IF:  You have pain that is getting worse and is not relieved by medications.  You develop chest pain  that is associated with shortness or breath, sweating, feeling sick to your stomach (nauseous), or throw up (vomit).  Your pain becomes localized to the abdomen.  You develop any new symptoms that seem different or that concern you. MAKE SURE YOU:   Understand these instructions.  Will watch your condition.  Will get help right away if you are not doing well or get worse.   This information is not intended to replace advice given to you by your health care provider. Make sure you discuss any questions you have with your health care provider.   Document Released: 08/17/2005 Document Revised: 11/09/2011 Document Reviewed: 04/21/2013 Elsevier Interactive Patient Education 2016 [employer] Radicular Pain Radicular pain in either the arm or leg is usually from a bulging or herniated disk in the spine. A piece of the herniated disk may press against the nerves as the nerves exit the spine. This causes pain which is felt at the tips of the nerves down the arm or leg. Other causes of radicular pain may include:  Fractures.  Heart disease.  Cancer.  An abnormal and usually degenerative state of the nervous system or nerves (neuropathy). Diagnosis may require CT or MRI scanning to determine the primary cause.  Nerves that start at the neck (nerve roots) may cause radicular pain in the outer shoulder and arm. It can spread down to the thumb and fingers. The symptoms vary depending on which nerve root has been affected. In most cases radicular  pain improves with conservative treatment. Neck problems may require physical therapy, a neck collar, or cervical traction. Treatment may take many weeks, and surgery may be considered if the symptoms do not improve.  Conservative treatment is also recommended for sciatica. Sciatica causes pain to radiate from the lower back or buttock area down the leg into the foot. Often there is a history of back problems. Most patients with sciatica are better after 2  to 4 weeks of rest and other supportive care. Short term bed rest can reduce the disk pressure considerably. Sitting, however, is not a good position since this increases the pressure on the disk. You should avoid bending, lifting, and all other activities which make the problem worse. Traction can be used in severe cases. Surgery is usually reserved for patients who do not improve within the first months of treatment. Only take over-the-counter or prescription medicines for pain, discomfort, or fever as directed by your caregiver. Narcotics and muscle relaxants may help by relieving more severe pain and spasm and by providing mild sedation. Cold or massage can give significant relief. Spinal manipulation is not recommended. It can increase the degree of disc protrusion. Epidural steroid injections are often effective treatment for radicular pain. These injections deliver medicine to the spinal nerve in the space between the protective covering of the spinal cord and back bones (vertebrae). Your caregiver can give you more information about steroid injections. These injections are most effective when given within two weeks of the onset of pain.  You should see your caregiver for follow up care as recommended. A program for neck and back injury rehabilitation with stretching and strengthening exercises is an important part of management.  SEEK IMMEDIATE MEDICAL CARE IF:  You develop increased pain, weakness, or numbness in your arm or leg.  You develop difficulty with bladder or bowel control.  You develop abdominal pain.   This information is not intended to replace advice given to you by your health care provider. Make sure you discuss any questions you have with your health care provider.   Document Released: 09/24/2004 Document Revised: 09/07/2014 Document Reviewed: 03/13/2015 Elsevier Interactive Patient Education [employer].

## 2016-10-08 DIAGNOSIS — R040 Epistaxis: Secondary | ICD-10-CM | POA: Diagnosis not present

## 2016-10-10 ENCOUNTER — Emergency Department (HOSPITAL_COMMUNITY)
Admission: EM | Admit: 2016-10-10 | Discharge: 2016-10-10 | Disposition: A | Discharge status: Home / Self Care | Payer: BLUE CROSS/BLUE SHIELD | Attending: Emergency Medicine | Admitting: Emergency Medicine

## 2016-10-10 ENCOUNTER — Encounter (HOSPITAL_COMMUNITY): Payer: Self-pay | Admitting: Emergency Medicine

## 2016-10-10 DIAGNOSIS — E039 Hypothyroidism, unspecified: Secondary | ICD-10-CM | POA: Insufficient documentation

## 2016-10-10 DIAGNOSIS — I10 Essential (primary) hypertension: Secondary | ICD-10-CM | POA: Insufficient documentation

## 2016-10-10 DIAGNOSIS — Z79899 Other long term (current) drug therapy: Secondary | ICD-10-CM | POA: Diagnosis not present

## 2016-10-10 LAB — CBC
HCT: 39.9 % (ref 36.0–46.0)
Hemoglobin: 13.3 g/dL (ref 12.0–15.0)
MCH: 29.4 pg (ref 26.0–34.0)
MCHC: 33.3 g/dL (ref 30.0–36.0)
MCV: 88.1 fL (ref 78.0–100.0)
PLATELETS: 219 10*3/uL (ref 150–400)
RBC: 4.53 MIL/uL (ref 3.87–5.11)
RDW: 13 % (ref 11.5–15.5)
WBC: 8.6 10*3/uL (ref 4.0–10.5)

## 2016-10-10 LAB — BASIC METABOLIC PANEL
Anion gap: 8 (ref 5–15)
BUN: 13 mg/dL (ref 6–20)
CALCIUM: 9.6 mg/dL (ref 8.9–10.3)
CHLORIDE: 102 mmol/L (ref 101–111)
CO2: 28 mmol/L (ref 22–32)
CREATININE: 0.52 mg/dL (ref 0.44–1.00)
GFR calc non Af Amer: >60 mL/min (ref >60)
Glucose, Bld: 84 mg/dL (ref 65–99)
Potassium: 3.3 mmol/L — ABNORMAL LOW (ref 3.5–5.1)
SODIUM: 138 mmol/L (ref 135–145)

## 2016-10-10 LAB — URINALYSIS, ROUTINE W REFLEX MICROSCOPIC
Bilirubin Urine: NEGATIVE
GLUCOSE, UA: NEGATIVE mg/dL
HGB URINE DIPSTICK: NEGATIVE
KETONES UR: NEGATIVE mg/dL
LEUKOCYTES UA: NEGATIVE
Nitrite: NEGATIVE
PROTEIN: NEGATIVE mg/dL
Specific Gravity, Urine: 1.006 (ref 1.005–1.030)
pH: 7 (ref 5.0–8.0)

## 2016-10-10 NOTE — ED Notes (Signed)
Pt states she has a headache, ? Due to elevated bp. Recent change in meds by PMD.

## 2016-10-10 NOTE — ED Triage Notes (Addendum)
Pt reports her BP has been running higher than usual. Systolic has been in the [id number] range for the past 4 days accompanied by lightheadedness. Pt reports systolic BP normally in [id number] range. Has also had intermittent nosebleeds and HA at home. Recently had BP medication increased.

## 2016-10-11 NOTE — ED Provider Notes (Signed)
[facility] DEPT Provider Note   CSN: [id number] Arrival date & time: 10/10/16  1637     History   Chief Complaint Chief Complaint  Patient presents with  . Dizziness    HPI Monica Velazquez is a 56 y.o. female.  Patient is a 55 year old female with a history of hypertension who presents with elevated blood pressure. She states her blood pressure has not been well controlled over the last couple months. About a month ago her blood pressure medicine was increased by her PCP but she says her blood pressure still elevated. She's had some intermittent bifrontal-type headaches. She denies any current headache.  She also has had some lightheadedness intermittently. She states usually happens when she is up walking. She denies any vertiginous type symptoms. No nausea or vomiting. No speech deficits or vision changes. No numbness or weakness to her extremities. No neck pain. She's had 3 spontaneous nosebleeds over the last month. They have all stop spontaneously. She denies any chest pain or shortness of breath.      Past Medical History:  Diagnosis Date  . HTN (hypertension)   . Hypothyroid   . Iron deficiency anemia   . Menometrorrhagia   . Uterine fibroid     Patient Active Problem List   Diagnosis Date Noted  . Iron deficiency anemia     Past Surgical History:  Procedure Laterality Date  . right thyroidectomy      OB History    No data available       Home Medications    Prior to Admission medications   Medication Sig Start Date End Date Taking? Authorizing Provider  Cholecalciferol (VITAMIN D3) 1000 units CAPS Take 1,000 Units by mouth daily.   Yes Historical Provider, MD  ferrous sulfate 325 (65 FE) MG tablet Take 325 mg by mouth daily with breakfast.   Yes Historical Provider, MD  levothyroxine (SYNTHROID, LEVOTHROID) 137 MCG tablet Take 137 mcg by mouth every morning. 03/02/13  Yes Historical Provider, MD  lisinopril-hydrochlorothiazide (PRINZIDE,ZESTORETIC)  20-25 MG tablet Take 1 tablet by mouth daily. 03/02/13  Yes Historical Provider, MD  Multiple Vitamins-Minerals (MULTIVITAMIN ADULTS) TABS Take 1 tablet by mouth daily.   Yes Historical Provider, MD  thiamine (VITAMIN B-1) 100 MG tablet Take 100 mg by mouth daily.   Yes Historical Provider, MD    Family History Family History  Problem Relation Age of Onset  . Diabetes Mother   . Hypertension Mother   . Stroke Father   . Hypertension Sister   . Sickle cell trait Sister   . Hypertension Brother   . Diabetes Brother   . Hypertension Sister   . Sickle cell anemia Other     Social History Social History  Substance Use Topics  . Smoking status: Never Smoker  . Smokeless tobacco: Never Used  . Alcohol use No     Allergies   Patient has no known allergies.   Review of Systems Review of Systems  Constitutional: Negative for chills, diaphoresis, fatigue and fever.  HENT: Positive for nosebleeds. Negative for congestion, rhinorrhea and sneezing.   Eyes: Negative.   Respiratory: Negative for cough, chest tightness and shortness of breath.   Cardiovascular: Negative for chest pain and leg swelling.  Gastrointestinal: Negative for abdominal pain, blood in stool, diarrhea, nausea and vomiting.  Genitourinary: Negative for difficulty urinating, flank pain, frequency and hematuria.  Musculoskeletal: Negative for arthralgias and back pain.  Skin: Negative for rash.  Neurological: Positive for light-headedness and headaches. Negative for  dizziness, speech difficulty, weakness and numbness.     Physical Exam Updated Vital Signs BP 179/98   Pulse 65   Temp 98 F (36.7 C) (Oral)   Resp 15   Ht 5\' 3"  (1.6 m)   Wt 166 lb (75.3 kg)   SpO2 98%   BMI 29.41 kg/m   Physical Exam  Constitutional: She is oriented to person, place, and time. She appears well-developed and well-nourished.  HENT:  Head: Normocephalic and atraumatic.  Eyes: Pupils are equal, round, and reactive to light.    Neck: Normal range of motion. Neck supple.  Cardiovascular: Normal rate, regular rhythm and normal heart sounds.   Pulmonary/Chest: Effort normal and breath sounds normal. No respiratory distress. She has no wheezes. She has no rales. She exhibits no tenderness.  Abdominal: Soft. Bowel sounds are normal. There is no tenderness. There is no rebound and no guarding.  Musculoskeletal: Normal range of motion. She exhibits no edema.  Lymphadenopathy:    She has no cervical adenopathy.  Neurological: She is alert and oriented to person, place, and time.  Motor 5/5 all extremities Sensation grossly intact to LT all extremities Finger to Nose intact, no pronator drift CN II-XII grossly intact Gait normal   Skin: Skin is warm and dry. No rash noted.  Psychiatric: She has a normal mood and affect.     ED Treatments / Results  Labs (all labs ordered are listed, but only abnormal results are displayed) Labs Reviewed  BASIC METABOLIC PANEL - Abnormal; Notable for the following:       Result Value   Potassium 3.3 (*)    All other components within normal limits  URINALYSIS, ROUTINE W REFLEX MICROSCOPIC - Abnormal; Notable for the following:    Color, Urine STRAW (*)    All other components within normal limits  CBC    EKG  EKG Interpretation  Date/Time:  Saturday October 10 2016 17:36:26 EST Ventricular Rate:  81 PR Interval:    QRS Duration: 97 QT Interval:  396 QTC Calculation: 460 R Axis:   -2 Text Interpretation:  Sinus rhythm Baseline wander in lead(s) II V6 since last tracing no significant change mild T  wave flattening anteriorly Reconfirmed by Junius Faucett  MD, Nitin Mckowen ([id number]) on 10/10/2016 9:47:16 PM Also confirmed by Carnie Bruemmer  MD, Zafirah Vanzee ([id number]), editor [place] [facility], Leda Gauze [phone])  on 10/11/2016 8:34:48 AM       Radiology No results found.  Procedures Procedures (including critical care time)  Medications Ordered in ED Medications - No data to display   Initial Impression  / Assessment and Plan / ED Course  I have reviewed the triage vital signs and the nursing notes.  Pertinent labs & imaging results that were available during my care of the patient were reviewed by me and considered in my medical decision making (see chart for details).     Patient presents with an elevated blood pressure. She's currently asymptomatic. There is no anginal symptoms. No neurologic deficits. No other suggestions of endorgan damage. Her labs are reassuring. She was advised to follow-up with her PCP on Monday and keep a running log of her blood pressure measurements. Return precautions were given.  Final Clinical Impressions(s) / ED Diagnoses   Final diagnoses:  Essential hypertension    New Prescriptions Discharge Medication List as of 10/10/2016 10:51 PM       Malvin Johns, MD 10/11/16 1500

## 2016-10-15 ENCOUNTER — Ambulatory Visit: Payer: PRIVATE HEALTH INSURANCE | Admitting: Family Medicine

## 2016-10-16 ENCOUNTER — Encounter: Payer: Self-pay | Admitting: Physician Assistant

## 2016-10-16 ENCOUNTER — Ambulatory Visit (INDEPENDENT_AMBULATORY_CARE_PROVIDER_SITE_OTHER): Payer: BLUE CROSS/BLUE SHIELD | Admitting: Physician Assistant

## 2016-10-16 DIAGNOSIS — I10 Essential (primary) hypertension: Secondary | ICD-10-CM

## 2016-10-16 DIAGNOSIS — E039 Hypothyroidism, unspecified: Secondary | ICD-10-CM

## 2016-10-16 LAB — BASIC METABOLIC PANEL
BUN: 17 mg/dL (ref 6–23)
CHLORIDE: 102 meq/L (ref 96–112)
CO2: 31 meq/L (ref 19–32)
CREATININE: 0.61 mg/dL (ref 0.40–1.20)
Calcium: 9.9 mg/dL (ref 8.4–10.5)
GFR: 131.06 mL/min (ref >60.00)
Glucose, Bld: 87 mg/dL (ref 70–99)
POTASSIUM: 4.3 meq/L (ref 3.5–5.1)
Sodium: 140 mEq/L (ref 135–145)

## 2016-10-16 LAB — T4, FREE: FREE T4: 1.02 ng/dL (ref 0.60–1.60)

## 2016-10-16 LAB — TSH: TSH: 1.85 u[IU]/mL (ref 0.35–4.50)

## 2016-10-16 NOTE — Progress Notes (Signed)
Patient presents to clinic today to establish care.   Chronic Issues: Hypertension -- Patient is currently on lisinopril-HCTZ 20-25 mg daily. Endorses taking medication as directed. Patient denies chest pain, palpitations, lightheadedness, dizziness, vision changes or frequent headaches.  BP Readings from Last 3 Encounters:  10/16/16 138/88  10/10/16 179/98  02/21/16 174/93   Hypothyroidism -- s/p R thyroidectomy. Is currently on levothyroxine. Is prescribed 150 mcg daily but has only been taking 75 mcg daily. Endorses some fatigue. Denies skin changes, worsening mood or constipation.   Health Maintenance: Immunizations -- Tetanus up-to-date. Declines flu shot. Colonoscopy -- Last 3 years ago per patient - polyp noted. Repeat 5 years.   Past Medical History:  Diagnosis Date  . HTN (hypertension)   . Hypothyroid   . Iron deficiency anemia   . Menometrorrhagia   . Uterine fibroid     Past Surgical History:  Procedure Laterality Date  . right thyroidectomy      Current Outpatient Prescriptions on File Prior to Visit  Medication Sig Dispense Refill  . Cholecalciferol (VITAMIN D3) 1000 units CAPS Take 2,000 Units by mouth daily.     . ferrous sulfate 325 (65 FE) MG tablet Take 325 mg by mouth daily with breakfast.    . levothyroxine (SYNTHROID, LEVOTHROID) 137 MCG tablet Take 75 mcg by mouth every morning.     Marland Kitchen lisinopril-hydrochlorothiazide (PRINZIDE,ZESTORETIC) 20-25 MG tablet Take 1 tablet by mouth daily.    . Multiple Vitamins-Minerals (MULTIVITAMIN ADULTS) TABS Take 1 tablet by mouth daily.     No current facility-administered medications on file prior to visit.     No Known Allergies  Family History  Problem Relation Age of Onset  . Hypertension Mother   . Stroke Father   . Hypertension Sister   . Sickle cell trait Sister   . Hypertension Brother   . Diabetes Brother   . Hypertension Sister   . Sickle cell anemia Other   . Hypertension Maternal Grandfather      Social History   Social History  . Marital status: Married    Spouse name: N/A  . Number of children: 4  . Years of education: N/A   Occupational History  . 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 Smoking status: Never Smoker  . Smokeless tobacco: Never Used  . Alcohol use No  . Drug use: No  . Sexual activity: Yes   Other Topics Concern  . Not on file   Social History Narrative  . No narrative on file    Review of Systems  Constitutional: Positive for malaise/fatigue. Negative for fever and weight loss.  HENT: Negative for ear discharge, ear pain, hearing loss and tinnitus.   Eyes: Negative for blurred vision, double vision, photophobia and pain.  Respiratory: Negative for cough and shortness of breath.   Cardiovascular: Negative for chest pain and palpitations.  Gastrointestinal: Negative for abdominal pain, blood in stool, constipation, diarrhea, heartburn, melena, nausea and vomiting.  Genitourinary: Negative for dysuria, flank pain, frequency, hematuria and urgency.  Musculoskeletal: Negative for falls.  Neurological: Negative for dizziness, loss of consciousness and headaches.  Endo/Heme/Allergies: Negative for environmental allergies.  Psychiatric/Behavioral: Negative for depression, hallucinations, substance abuse and suicidal ideas. The patient is not nervous/anxious and does not have insomnia.    BP 138/88   Pulse 79   Temp 98.5 F (36.9 C) (Oral)   Resp 14   Ht 5' 3.5" (1.613  m)   Wt 163 lb (73.9 kg)   SpO2 98%   BMI 28.42 kg/m   Physical Exam  Constitutional: She is oriented to person, place, and time and well-developed, well-nourished, and in no distress.  HENT:  Head: Normocephalic and atraumatic.  Eyes: Conjunctivae are normal. Pupils are equal, round, and reactive to light.  Neck: Neck supple.  Cardiovascular: Normal rate, regular rhythm, normal heart sounds and intact distal  pulses.   Pulmonary/Chest: Effort normal and breath sounds normal. No respiratory distress. She has no wheezes. She has no rales. She exhibits no tenderness.  Neurological: She is alert and oriented to person, place, and time.  Skin: Skin is warm and dry. No rash noted.  Psychiatric: Affect normal.  Vitals reviewed.   Recent Results (from the past 2160 hour(s))  Urinalysis, Routine w reflex microscopic     Status: Abnormal   Collection Time: 10/10/16  5:31 PM  Result Value Ref Range   Color, Urine STRAW (A) YELLOW   APPearance CLEAR CLEAR   Specific Gravity, Urine 1.006 1.005 - 1.030   pH 7.0 5.0 - 8.0   Glucose, UA NEGATIVE NEGATIVE mg/dL   Hgb urine dipstick NEGATIVE NEGATIVE   Bilirubin Urine NEGATIVE NEGATIVE   Ketones, ur NEGATIVE NEGATIVE mg/dL   Protein, ur NEGATIVE NEGATIVE mg/dL   Nitrite NEGATIVE NEGATIVE   Leukocytes, UA NEGATIVE NEGATIVE  Basic metabolic panel     Status: Abnormal   Collection Time: 10/10/16  5:45 PM  Result Value Ref Range   Sodium 138 135 - 145 mmol/L   Potassium 3.3 (L) 3.5 - 5.1 mmol/L   Chloride 102 101 - 111 mmol/L   CO2 28 22 - 32 mmol/L   Glucose, Bld 84 65 - 99 mg/dL   BUN 13 6 - 20 mg/dL   Creatinine, Ser 0.52 0.44 - 1.00 mg/dL   Calcium 9.6 8.9 - 10.3 mg/dL   GFR calc non Af Amer >60 >60 mL/min   GFR calc Af Amer >60 >60 mL/min    Comment: (NOTE) The eGFR has been calculated using the CKD EPI equation. This calculation has not been validated in all clinical situations. eGFR's persistently <60 mL/min signify possible Chronic Kidney Disease.    Anion gap 8 5 - 15  CBC     Status: None   Collection Time: 10/10/16  5:45 PM  Result Value Ref Range   WBC 8.6 4.0 - 10.5 K/uL   RBC 4.53 3.87 - 5.11 MIL/uL   Hemoglobin 13.3 12.0 - 15.0 g/dL   HCT 39.9 36.0 - 46.0 %   MCV 88.1 78.0 - 100.0 fL   MCH 29.4 26.0 - 34.0 pg   MCHC 33.3 30.0 - 36.0 g/dL   RDW 13.0 11.5 - 15.5 %   Platelets 219 150 - 400 K/uL  TSH     Status: None    Collection Time: 10/16/16  3:25 PM  Result Value Ref Range   TSH 1.85 0.35 - 4.50 uIU/mL  T4, free     Status: None   Collection Time: 10/16/16  3:25 PM  Result Value Ref Range   Free T4 1.02 0.60 - 1.60 ng/dL    Comment: Specimens from patients who are undergoing biotin therapy and /or ingesting biotin supplements may contain high levels of biotin.  The higher biotin concentration in these specimens interferes with this Free T4 assay.  Specimens that contain high levels  of biotin may cause false high results for this Free T4 assay.  Please  interpret results in light of the total clinical presentation of the patient.    Basic metabolic panel     Status: None   Collection Time: 10/16/16  3:25 PM  Result Value Ref Range   Sodium 140 135 - 145 mEq/L   Potassium 4.3 3.5 - 5.1 mEq/L   Chloride 102 96 - 112 mEq/L   CO2 31 19 - 32 mEq/L   Glucose, Bld 87 70 - 99 mg/dL   BUN 17 6 - 23 mg/dL   Creatinine, Ser 0.61 0.40 - 1.20 mg/dL   Calcium 9.9 8.4 - 10.5 mg/dL   GFR 131.06 >60.00 mL/min   Assessment/Plan: HTN (hypertension) BP improved today from recent ER visit. Is taking medications as directed. Will start DASH diet.  Repeat labs today. Patient has new cuff at home -- check BP daily and record.  Will review BP reading this coming week. If elevating at home, will alter her regimen.  Hypothyroid Patient prescribed 2 37mg tablets (150 mcg) daily by her previous PCP but has only bee taking 75 mcg daily. Will check TSH level today and alter regimen based on results.     MLeeanne Rio PA-C

## 2016-10-16 NOTE — Progress Notes (Signed)
Pre visit review using our clinic review tool, if applicable. No additional management support is needed unless otherwise documented below in the visit note. 

## 2016-10-16 NOTE — Patient Instructions (Signed)
Please go to the lab for blood work. I will call you with your results.  For now, continue BP medication as directed. Limit caffeine intake. Start diet below.  Check BP once daily about an hour after you take your medication.  Write this down.  We will discuss when I call you on Monday with lab results.  DASH Eating Plan DASH stands for "Dietary Approaches to Stop Hypertension." The DASH eating plan is a healthy eating plan that has been shown to reduce high blood pressure (hypertension). Additional health benefits may include reducing the risk of type 2 diabetes mellitus, heart disease, and stroke. The DASH eating plan may also help with weight loss. What do I need to know about the DASH eating plan? For the DASH eating plan, you will follow these general guidelines:  Choose foods with less than 150 milligrams of sodium per serving (as listed on the food label).  Use salt-free seasonings or herbs instead of table salt or sea salt.  Check with your health care provider or pharmacist before using salt substitutes.  Eat lower-sodium products. These are often labeled as "low-sodium" or "no salt added."  Eat fresh foods. Avoid eating a lot of canned foods.  Eat more vegetables, fruits, and low-fat dairy products.  Choose whole grains. Look for the word "whole" as the first word in the ingredient list.  Choose fish and skinless chicken or [place] more often than red meat. Limit fish, poultry, and meat to 6 oz (170 g) each day.  Limit sweets, desserts, sugars, and sugary drinks.  Choose heart-healthy fats.  Eat more home-cooked food and less restaurant, buffet, and fast food.  Limit fried foods.  Do not fry foods. Cook foods using methods such as baking, boiling, grilling, and broiling instead.  When eating at a restaurant, ask that your food be prepared with less salt, or no salt if possible. What foods can I eat? Seek help from a dietitian for individual calorie needs. Grains   Whole grain or whole wheat bread. Brown rice. Whole grain or whole wheat pasta. Quinoa, bulgur, and whole grain cereals. Low-sodium cereals. Corn or whole wheat flour tortillas. Whole grain cornbread. Whole grain crackers. Low-sodium crackers. Vegetables  Fresh or frozen vegetables (raw, steamed, roasted, or grilled). Low-sodium or reduced-sodium tomato and vegetable juices. Low-sodium or reduced-sodium tomato sauce and paste. Low-sodium or reduced-sodium canned vegetables. Fruits  All fresh, canned (in natural juice), or frozen fruits. Meat and Other Protein Products  Ground beef (85% or leaner), grass-fed beef, or beef trimmed of fat. Skinless chicken or [place]. Ground chicken or [place]. Pork trimmed of fat. All fish and seafood. Eggs. Dried beans, peas, or lentils. Unsalted nuts and seeds. Unsalted canned beans. Dairy  Low-fat dairy products, such as skim or 1% milk, 2% or reduced-fat cheeses, low-fat ricotta or cottage cheese, or plain low-fat yogurt. Low-sodium or reduced-sodium cheeses. Fats and Oils  Tub margarines without trans fats. Light or reduced-fat mayonnaise and salad dressings (reduced sodium). Avocado. Safflower, olive, or canola oils. Natural peanut or almond butter. Other  Unsalted popcorn and pretzels. The items listed above may not be a complete list of recommended foods or beverages. Contact your dietitian for more options.  What foods are not recommended? Grains  White bread. White pasta. White rice. Refined cornbread. Bagels and croissants. Crackers that contain trans fat. Vegetables  Creamed or fried vegetables. Vegetables in a cheese sauce. Regular canned vegetables. Regular canned tomato sauce and paste. Regular tomato and vegetable juices. Fruits  Canned fruit in light or heavy syrup. Fruit juice. Meat and Other Protein Products  Fatty cuts of meat. Ribs, chicken wings, bacon, sausage, bologna, salami, chitterlings, fatback, hot dogs, bratwurst, and packaged  luncheon meats. Salted nuts and seeds. Canned beans with salt. Dairy  Whole or 2% milk, cream, half-and-half, and cream cheese. Whole-fat or sweetened yogurt. Full-fat cheeses or blue cheese. Nondairy creamers and whipped toppings. Processed cheese, cheese spreads, or cheese curds. Condiments  Onion and garlic salt, seasoned salt, table salt, and sea salt. Canned and packaged gravies. Worcestershire sauce. Tartar sauce. Barbecue sauce. Teriyaki sauce. Soy sauce, including reduced sodium. Steak sauce. Fish sauce. Oyster sauce. Cocktail sauce. Horseradish. Ketchup and mustard. Meat flavorings and tenderizers. Bouillon cubes. Hot sauce. Tabasco sauce. Marinades. Taco seasonings. Relishes. Fats and Oils  Butter, stick margarine, lard, shortening, ghee, and bacon fat. Coconut, palm kernel, or palm oils. Regular salad dressings. Other  Pickles and olives. Salted popcorn and pretzels. The items listed above may not be a complete list of foods and beverages to avoid. Contact your dietitian for more information.  Where can I find more information? National Heart, Lung, and Blood Institute: travelstabloid.com This information is not intended to replace advice given to you by your health care provider. Make sure you discuss any questions you have with your health care provider. Document Released: 08/06/2011 Document Revised: 01/23/2016 Document Reviewed: 06/21/2013 Elsevier Interactive Patient Education  2017 [employer].

## 2016-10-18 NOTE — Assessment & Plan Note (Signed)
BP improved today from recent ER visit. Is taking medications as directed. Will start DASH diet.  Repeat labs today. Patient has new cuff at home -- check BP daily and record.  Will review BP reading this coming week. If elevating at home, will alter her regimen.

## 2016-10-18 NOTE — Assessment & Plan Note (Signed)
Patient prescribed 2 48mcg tablets (150 mcg) daily by her previous PCP but has only bee taking 75 mcg daily. Will check TSH level today and alter regimen based on results.

## 2016-10-19 ENCOUNTER — Ambulatory Visit: Payer: PRIVATE HEALTH INSURANCE | Admitting: Family Medicine

## 2016-10-22 ENCOUNTER — Other Ambulatory Visit: Payer: Self-pay | Admitting: Physician Assistant

## 2016-10-22 DIAGNOSIS — D229 Melanocytic nevi, unspecified: Secondary | ICD-10-CM

## 2016-10-26 ENCOUNTER — Other Ambulatory Visit: Payer: Self-pay | Admitting: Emergency Medicine

## 2016-10-26 ENCOUNTER — Telehealth: Payer: Self-pay | Admitting: Physician Assistant

## 2016-10-26 MED ORDER — LISINOPRIL-HYDROCHLOROTHIAZIDE 20-25 MG PO TABS: 1.0000 | ORAL_TABLET | Freq: Every day | ORAL | 3 refills | Status: DC

## 2016-10-26 NOTE — Telephone Encounter (Signed)
Rx sent to the preferred patient pharmacy  

## 2016-10-26 NOTE — Telephone Encounter (Signed)
Patient requesting refill of lisinopril-hydrochlorothiazide (PRINZIDE,ZESTORETIC) 20-25 MG tablet.  Pharmacy: [facility] [address] ([address]), [place] [id number] (Phone) [phone] (Fax)

## 2016-11-24 DIAGNOSIS — L820 Inflamed seborrheic keratosis: Secondary | ICD-10-CM | POA: Insufficient documentation

## 2016-11-24 DIAGNOSIS — L821 Other seborrheic keratosis: Secondary | ICD-10-CM | POA: Insufficient documentation

## 2017-03-15 DIAGNOSIS — R635 Abnormal weight gain: Secondary | ICD-10-CM | POA: Diagnosis not present

## 2017-03-15 DIAGNOSIS — F330 Major depressive disorder, recurrent, mild: Secondary | ICD-10-CM | POA: Diagnosis not present

## 2017-03-15 DIAGNOSIS — E039 Hypothyroidism, unspecified: Secondary | ICD-10-CM | POA: Diagnosis not present

## 2017-03-15 DIAGNOSIS — I10 Essential (primary) hypertension: Secondary | ICD-10-CM | POA: Diagnosis not present

## 2017-03-15 DIAGNOSIS — G4700 Insomnia, unspecified: Secondary | ICD-10-CM | POA: Diagnosis not present

## 2017-04-12 DIAGNOSIS — I10 Essential (primary) hypertension: Secondary | ICD-10-CM | POA: Diagnosis not present

## 2017-04-12 DIAGNOSIS — R7303 Prediabetes: Secondary | ICD-10-CM | POA: Diagnosis not present

## 2017-04-12 DIAGNOSIS — F330 Major depressive disorder, recurrent, mild: Secondary | ICD-10-CM | POA: Diagnosis not present

## 2017-04-12 DIAGNOSIS — E039 Hypothyroidism, unspecified: Secondary | ICD-10-CM | POA: Diagnosis not present

## 2017-05-24 DIAGNOSIS — Z6831 Body mass index (BMI) 31.0-31.9, adult: Secondary | ICD-10-CM | POA: Diagnosis not present

## 2017-05-24 DIAGNOSIS — Z01419 Encounter for gynecological examination (general) (routine) without abnormal findings: Secondary | ICD-10-CM | POA: Diagnosis not present

## 2017-05-24 DIAGNOSIS — Z1231 Encounter for screening mammogram for malignant neoplasm of breast: Secondary | ICD-10-CM | POA: Diagnosis not present

## 2017-05-24 LAB — HM PAP SMEAR: HM Pap smear: ABNORMAL

## 2017-06-18 DIAGNOSIS — F330 Major depressive disorder, recurrent, mild: Secondary | ICD-10-CM | POA: Diagnosis not present

## 2017-06-18 DIAGNOSIS — E039 Hypothyroidism, unspecified: Secondary | ICD-10-CM | POA: Diagnosis not present

## 2017-06-18 DIAGNOSIS — I10 Essential (primary) hypertension: Secondary | ICD-10-CM | POA: Diagnosis not present

## 2017-06-18 DIAGNOSIS — R739 Hyperglycemia, unspecified: Secondary | ICD-10-CM | POA: Diagnosis not present

## 2017-06-25 DIAGNOSIS — R0982 Postnasal drip: Secondary | ICD-10-CM | POA: Diagnosis not present

## 2017-06-25 DIAGNOSIS — R05 Cough: Secondary | ICD-10-CM | POA: Diagnosis not present

## 2017-06-25 DIAGNOSIS — Z6830 Body mass index (BMI) 30.0-30.9, adult: Secondary | ICD-10-CM | POA: Diagnosis not present

## 2017-06-25 DIAGNOSIS — I10 Essential (primary) hypertension: Secondary | ICD-10-CM | POA: Diagnosis not present

## 2017-06-25 DIAGNOSIS — J029 Acute pharyngitis, unspecified: Secondary | ICD-10-CM | POA: Diagnosis not present

## 2017-07-19 DIAGNOSIS — I10 Essential (primary) hypertension: Secondary | ICD-10-CM | POA: Diagnosis not present

## 2017-07-19 DIAGNOSIS — R7303 Prediabetes: Secondary | ICD-10-CM | POA: Diagnosis not present

## 2017-07-19 DIAGNOSIS — E039 Hypothyroidism, unspecified: Secondary | ICD-10-CM | POA: Diagnosis not present

## 2017-07-19 DIAGNOSIS — G4700 Insomnia, unspecified: Secondary | ICD-10-CM | POA: Diagnosis not present

## 2017-11-05 DIAGNOSIS — G4700 Insomnia, unspecified: Secondary | ICD-10-CM | POA: Diagnosis not present

## 2017-11-05 DIAGNOSIS — R7303 Prediabetes: Secondary | ICD-10-CM | POA: Diagnosis not present

## 2017-11-05 DIAGNOSIS — I10 Essential (primary) hypertension: Secondary | ICD-10-CM | POA: Diagnosis not present

## 2017-11-05 DIAGNOSIS — F330 Major depressive disorder, recurrent, mild: Secondary | ICD-10-CM | POA: Diagnosis not present

## 2017-11-05 DIAGNOSIS — E039 Hypothyroidism, unspecified: Secondary | ICD-10-CM | POA: Diagnosis not present

## 2017-11-26 DIAGNOSIS — E039 Hypothyroidism, unspecified: Secondary | ICD-10-CM | POA: Diagnosis not present

## 2017-11-26 DIAGNOSIS — I10 Essential (primary) hypertension: Secondary | ICD-10-CM | POA: Diagnosis not present

## 2017-11-26 DIAGNOSIS — R739 Hyperglycemia, unspecified: Secondary | ICD-10-CM | POA: Diagnosis not present

## 2017-12-23 DIAGNOSIS — M25561 Pain in right knee: Secondary | ICD-10-CM | POA: Diagnosis not present

## 2018-01-19 ENCOUNTER — Encounter: Payer: Self-pay | Admitting: General Practice

## 2018-02-15 DIAGNOSIS — D509 Iron deficiency anemia, unspecified: Secondary | ICD-10-CM | POA: Diagnosis not present

## 2018-02-15 DIAGNOSIS — N950 Postmenopausal bleeding: Secondary | ICD-10-CM | POA: Diagnosis not present

## 2018-03-08 DIAGNOSIS — F330 Major depressive disorder, recurrent, mild: Secondary | ICD-10-CM | POA: Diagnosis not present

## 2018-03-08 DIAGNOSIS — G4700 Insomnia, unspecified: Secondary | ICD-10-CM | POA: Diagnosis not present

## 2018-03-08 DIAGNOSIS — E039 Hypothyroidism, unspecified: Secondary | ICD-10-CM | POA: Diagnosis not present

## 2018-03-08 DIAGNOSIS — I10 Essential (primary) hypertension: Secondary | ICD-10-CM | POA: Diagnosis not present

## 2018-05-16 DIAGNOSIS — G4700 Insomnia, unspecified: Secondary | ICD-10-CM | POA: Diagnosis not present

## 2018-05-16 DIAGNOSIS — I10 Essential (primary) hypertension: Secondary | ICD-10-CM | POA: Diagnosis not present

## 2018-05-16 DIAGNOSIS — E039 Hypothyroidism, unspecified: Secondary | ICD-10-CM | POA: Diagnosis not present

## 2018-05-16 DIAGNOSIS — E1100 Type 2 diabetes mellitus with hyperosmolarity without nonketotic hyperglycemic-hyperosmolar coma (NKHHC): Secondary | ICD-10-CM | POA: Diagnosis not present

## 2018-05-16 DIAGNOSIS — E785 Hyperlipidemia, unspecified: Secondary | ICD-10-CM | POA: Diagnosis not present

## 2018-05-16 DIAGNOSIS — R7303 Prediabetes: Secondary | ICD-10-CM | POA: Diagnosis not present

## 2018-08-24 LAB — HM MAMMOGRAPHY

## 2018-08-25 DIAGNOSIS — Z6831 Body mass index (BMI) 31.0-31.9, adult: Secondary | ICD-10-CM | POA: Diagnosis not present

## 2018-08-25 DIAGNOSIS — Z01419 Encounter for gynecological examination (general) (routine) without abnormal findings: Secondary | ICD-10-CM | POA: Diagnosis not present

## 2018-08-25 DIAGNOSIS — Z1231 Encounter for screening mammogram for malignant neoplasm of breast: Secondary | ICD-10-CM | POA: Diagnosis not present

## 2018-11-03 DIAGNOSIS — I10 Essential (primary) hypertension: Secondary | ICD-10-CM | POA: Diagnosis not present

## 2018-11-04 DIAGNOSIS — G4700 Insomnia, unspecified: Secondary | ICD-10-CM | POA: Diagnosis not present

## 2018-11-04 DIAGNOSIS — E785 Hyperlipidemia, unspecified: Secondary | ICD-10-CM | POA: Diagnosis not present

## 2018-11-04 DIAGNOSIS — I10 Essential (primary) hypertension: Secondary | ICD-10-CM | POA: Diagnosis not present

## 2018-11-04 DIAGNOSIS — E039 Hypothyroidism, unspecified: Secondary | ICD-10-CM | POA: Diagnosis not present

## 2018-11-23 ENCOUNTER — Encounter: Payer: Self-pay | Admitting: Emergency Medicine

## 2018-12-19 DIAGNOSIS — L820 Inflamed seborrheic keratosis: Secondary | ICD-10-CM | POA: Diagnosis not present

## 2018-12-19 DIAGNOSIS — L821 Other seborrheic keratosis: Secondary | ICD-10-CM | POA: Diagnosis not present

## 2019-03-10 DIAGNOSIS — R040 Epistaxis: Secondary | ICD-10-CM | POA: Insufficient documentation

## 2019-03-10 DIAGNOSIS — J302 Other seasonal allergic rhinitis: Secondary | ICD-10-CM | POA: Diagnosis not present

## 2019-03-10 DIAGNOSIS — H6123 Impacted cerumen, bilateral: Secondary | ICD-10-CM

## 2019-03-10 HISTORY — DX: Epistaxis: R04.0

## 2019-03-10 HISTORY — DX: Impacted cerumen, bilateral: H61.23

## 2019-03-14 DIAGNOSIS — Z6831 Body mass index (BMI) 31.0-31.9, adult: Secondary | ICD-10-CM | POA: Diagnosis not present

## 2019-03-14 DIAGNOSIS — E039 Hypothyroidism, unspecified: Secondary | ICD-10-CM | POA: Diagnosis not present

## 2019-03-14 DIAGNOSIS — E782 Mixed hyperlipidemia: Secondary | ICD-10-CM | POA: Diagnosis not present

## 2019-03-14 DIAGNOSIS — E785 Hyperlipidemia, unspecified: Secondary | ICD-10-CM | POA: Diagnosis not present

## 2019-03-14 DIAGNOSIS — E1169 Type 2 diabetes mellitus with other specified complication: Secondary | ICD-10-CM | POA: Diagnosis not present

## 2019-03-14 DIAGNOSIS — E1100 Type 2 diabetes mellitus with hyperosmolarity without nonketotic hyperglycemic-hyperosmolar coma (NKHHC): Secondary | ICD-10-CM | POA: Diagnosis not present

## 2019-03-14 DIAGNOSIS — I10 Essential (primary) hypertension: Secondary | ICD-10-CM | POA: Diagnosis not present

## 2019-03-25 DIAGNOSIS — M545 Low back pain: Secondary | ICD-10-CM | POA: Diagnosis not present

## 2019-04-13 DIAGNOSIS — K625 Hemorrhage of anus and rectum: Secondary | ICD-10-CM | POA: Diagnosis not present

## 2019-04-13 DIAGNOSIS — Z1211 Encounter for screening for malignant neoplasm of colon: Secondary | ICD-10-CM | POA: Diagnosis not present

## 2019-04-13 DIAGNOSIS — K5900 Constipation, unspecified: Secondary | ICD-10-CM | POA: Diagnosis not present

## 2019-04-13 DIAGNOSIS — Z86010 Personal history of colonic polyps: Secondary | ICD-10-CM | POA: Diagnosis not present

## 2019-05-09 DIAGNOSIS — Z20828 Contact with and (suspected) exposure to other viral communicable diseases: Secondary | ICD-10-CM | POA: Diagnosis not present

## 2019-05-16 DIAGNOSIS — Z20828 Contact with and (suspected) exposure to other viral communicable diseases: Secondary | ICD-10-CM | POA: Diagnosis not present

## 2019-05-22 DIAGNOSIS — Z20828 Contact with and (suspected) exposure to other viral communicable diseases: Secondary | ICD-10-CM | POA: Diagnosis not present

## 2019-05-29 DIAGNOSIS — Z20828 Contact with and (suspected) exposure to other viral communicable diseases: Secondary | ICD-10-CM | POA: Diagnosis not present

## 2019-06-02 DIAGNOSIS — D123 Benign neoplasm of transverse colon: Secondary | ICD-10-CM | POA: Diagnosis not present

## 2019-06-02 DIAGNOSIS — D125 Benign neoplasm of sigmoid colon: Secondary | ICD-10-CM | POA: Diagnosis not present

## 2019-06-02 DIAGNOSIS — K635 Polyp of colon: Secondary | ICD-10-CM | POA: Diagnosis not present

## 2019-06-02 DIAGNOSIS — Z1211 Encounter for screening for malignant neoplasm of colon: Secondary | ICD-10-CM | POA: Diagnosis not present

## 2019-06-05 DIAGNOSIS — Z20828 Contact with and (suspected) exposure to other viral communicable diseases: Secondary | ICD-10-CM | POA: Diagnosis not present

## 2019-06-12 DIAGNOSIS — Z20828 Contact with and (suspected) exposure to other viral communicable diseases: Secondary | ICD-10-CM | POA: Diagnosis not present

## 2019-06-19 DIAGNOSIS — Z20828 Contact with and (suspected) exposure to other viral communicable diseases: Secondary | ICD-10-CM | POA: Diagnosis not present

## 2019-06-26 DIAGNOSIS — Z20828 Contact with and (suspected) exposure to other viral communicable diseases: Secondary | ICD-10-CM | POA: Diagnosis not present

## 2019-08-07 DIAGNOSIS — E1169 Type 2 diabetes mellitus with other specified complication: Secondary | ICD-10-CM | POA: Diagnosis not present

## 2019-08-07 DIAGNOSIS — I10 Essential (primary) hypertension: Secondary | ICD-10-CM | POA: Diagnosis not present

## 2019-08-07 DIAGNOSIS — E785 Hyperlipidemia, unspecified: Secondary | ICD-10-CM | POA: Diagnosis not present

## 2019-08-07 DIAGNOSIS — G4700 Insomnia, unspecified: Secondary | ICD-10-CM | POA: Diagnosis not present

## 2019-08-07 DIAGNOSIS — F330 Major depressive disorder, recurrent, mild: Secondary | ICD-10-CM | POA: Diagnosis not present

## 2019-10-13 DIAGNOSIS — Z6829 Body mass index (BMI) 29.0-29.9, adult: Secondary | ICD-10-CM | POA: Diagnosis not present

## 2019-10-13 DIAGNOSIS — Z01419 Encounter for gynecological examination (general) (routine) without abnormal findings: Secondary | ICD-10-CM | POA: Diagnosis not present

## 2019-10-13 DIAGNOSIS — Z1231 Encounter for screening mammogram for malignant neoplasm of breast: Secondary | ICD-10-CM | POA: Diagnosis not present

## 2019-10-13 DIAGNOSIS — R87610 Atypical squamous cells of undetermined significance on cytologic smear of cervix (ASC-US): Secondary | ICD-10-CM | POA: Diagnosis not present

## 2019-10-13 DIAGNOSIS — Z13820 Encounter for screening for osteoporosis: Secondary | ICD-10-CM | POA: Diagnosis not present

## 2020-02-24 DIAGNOSIS — E039 Hypothyroidism, unspecified: Secondary | ICD-10-CM | POA: Diagnosis not present

## 2020-02-24 DIAGNOSIS — Z13228 Encounter for screening for other metabolic disorders: Secondary | ICD-10-CM | POA: Diagnosis not present

## 2020-02-26 DIAGNOSIS — D509 Iron deficiency anemia, unspecified: Secondary | ICD-10-CM | POA: Diagnosis not present

## 2020-02-26 DIAGNOSIS — Z13228 Encounter for screening for other metabolic disorders: Secondary | ICD-10-CM | POA: Diagnosis not present

## 2020-02-26 DIAGNOSIS — E559 Vitamin D deficiency, unspecified: Secondary | ICD-10-CM | POA: Diagnosis not present

## 2020-02-26 DIAGNOSIS — E039 Hypothyroidism, unspecified: Secondary | ICD-10-CM | POA: Diagnosis not present

## 2020-03-06 DIAGNOSIS — I10 Essential (primary) hypertension: Secondary | ICD-10-CM | POA: Diagnosis not present

## 2020-03-06 DIAGNOSIS — Z0000 Encounter for general adult medical examination without abnormal findings: Secondary | ICD-10-CM | POA: Diagnosis not present

## 2020-03-06 DIAGNOSIS — E039 Hypothyroidism, unspecified: Secondary | ICD-10-CM | POA: Diagnosis not present

## 2020-03-06 DIAGNOSIS — R609 Edema, unspecified: Secondary | ICD-10-CM | POA: Diagnosis not present

## 2020-10-31 DIAGNOSIS — Z01419 Encounter for gynecological examination (general) (routine) without abnormal findings: Secondary | ICD-10-CM | POA: Diagnosis not present

## 2020-10-31 DIAGNOSIS — Z6830 Body mass index (BMI) 30.0-30.9, adult: Secondary | ICD-10-CM | POA: Diagnosis not present

## 2020-10-31 DIAGNOSIS — Z1231 Encounter for screening mammogram for malignant neoplasm of breast: Secondary | ICD-10-CM | POA: Diagnosis not present

## 2020-11-12 DIAGNOSIS — H2513 Age-related nuclear cataract, bilateral: Secondary | ICD-10-CM | POA: Diagnosis not present

## 2021-04-02 DIAGNOSIS — I10 Essential (primary) hypertension: Secondary | ICD-10-CM | POA: Diagnosis not present

## 2021-04-02 DIAGNOSIS — E039 Hypothyroidism, unspecified: Secondary | ICD-10-CM | POA: Diagnosis not present

## 2021-04-04 DIAGNOSIS — R7303 Prediabetes: Secondary | ICD-10-CM | POA: Diagnosis not present

## 2021-04-04 DIAGNOSIS — Z0000 Encounter for general adult medical examination without abnormal findings: Secondary | ICD-10-CM | POA: Diagnosis not present

## 2021-04-08 DIAGNOSIS — D225 Melanocytic nevi of trunk: Secondary | ICD-10-CM | POA: Diagnosis not present

## 2021-04-08 DIAGNOSIS — L918 Other hypertrophic disorders of the skin: Secondary | ICD-10-CM | POA: Diagnosis not present

## 2021-04-08 DIAGNOSIS — L821 Other seborrheic keratosis: Secondary | ICD-10-CM | POA: Diagnosis not present

## 2021-06-02 ENCOUNTER — Ambulatory Visit: Payer: BLUE CROSS/BLUE SHIELD | Admitting: Skilled Nursing Facility1

## 2023-02-08 LAB — HM MAMMOGRAPHY

## 2023-02-10 LAB — HM PAP SMEAR

## 2023-07-06 LAB — LAB REPORT - SCANNED
Albumin, Urine POC: 44.4
Creatinine, POC: 70.1 mg/dL
Microalb Creat Ratio: 63

## 2023-11-05 ENCOUNTER — Ambulatory Visit: Payer: PRIVATE HEALTH INSURANCE | Admitting: Family

## 2023-12-07 ENCOUNTER — Ambulatory Visit (HOSPITAL_BASED_OUTPATIENT_CLINIC_OR_DEPARTMENT_OTHER): Payer: Self-pay | Admitting: Family Medicine

## 2024-01-18 ENCOUNTER — Ambulatory Visit (INDEPENDENT_AMBULATORY_CARE_PROVIDER_SITE_OTHER): Payer: Self-pay | Admitting: Family Medicine

## 2024-01-18 ENCOUNTER — Telehealth (HOSPITAL_BASED_OUTPATIENT_CLINIC_OR_DEPARTMENT_OTHER): Payer: Self-pay | Admitting: *Deleted

## 2024-01-18 ENCOUNTER — Encounter (HOSPITAL_BASED_OUTPATIENT_CLINIC_OR_DEPARTMENT_OTHER): Payer: Self-pay | Admitting: *Deleted

## 2024-01-18 ENCOUNTER — Encounter (HOSPITAL_BASED_OUTPATIENT_CLINIC_OR_DEPARTMENT_OTHER): Payer: Self-pay | Admitting: Family Medicine

## 2024-01-18 VITALS — BP 130/81 | HR 95 | Ht 63.5 in | Wt 170.0 lb

## 2024-01-18 DIAGNOSIS — N189 Chronic kidney disease, unspecified: Secondary | ICD-10-CM

## 2024-01-18 DIAGNOSIS — E1122 Type 2 diabetes mellitus with diabetic chronic kidney disease: Secondary | ICD-10-CM

## 2024-01-18 DIAGNOSIS — E039 Hypothyroidism, unspecified: Secondary | ICD-10-CM

## 2024-01-18 DIAGNOSIS — Z13220 Encounter for screening for lipoid disorders: Secondary | ICD-10-CM

## 2024-01-18 DIAGNOSIS — Z0000 Encounter for general adult medical examination without abnormal findings: Secondary | ICD-10-CM

## 2024-01-18 NOTE — Patient Instructions (Addendum)
 Shingrix vaccines  Tdap vaccine    Chronic Kidney disease:  Avoid NSAIDs (ibuprofen, naproxen  - aleve , advil)  Monitor protein intake Lots of clear fluids

## 2024-01-18 NOTE — Progress Notes (Signed)
 New Patient Office Visit  Subjective:   Monica Velazquez 03/14/1961 01/18/2024  Chief Complaint  Patient presents with   New Patient (Initial Visit)    Patient is here today to get established with the practice. States she has had some cramping in feet and hands and also gets nosebleeds. Also states she does become SOB more often.    HPI: Monica Velazquez presents today to establish care at [facility] at [facility]. Introduced to Publishing rights manager role and practice setting.  All questions answered.   Last PCP: [facility] (now [facility]) Last annual physical: Unknown Concerns: See below    CHRONIC KIDNEY DISEASE: Monica Velazquez presents for the medical management of Chronic Kidney Disease. Stage is unknown to patient. Will attempt to obtain records. No labs available in care everywhere. Patient states she was also placed on Kerendia 20mg  for CKD.  Patient is  adhering to renal diet. Patient is  on ACE1/ARB therapy; Irbesartan 150mg  qd.  Patient is  avoiding NSAIDS.    She Does not attend dialysis and does not perform peritoneal dialysis.     HYPOTHYROIDISM: Patient presents for the medical management of Hypothyroidism. She is s/p right sided thyroidectomy.  Current medication: Levothyroxine 100mcg  Patient compliant with medication regimen: yes  Fatigue: no Cold intolerance: no Weight gain/loss: no Constipation: no Lower extremity edema: no Palpitations: no Hoarseness: no Neck Pain/Compression: no Difficulty Swallowing: no  HYPERTENSION: Monica Velazquez presents for the medical management of hypertension.  Patient's current hypertension medication regimen is: Amlodipine 10mg , Irbesartan 150mg   Patient is  currently taking prescribed medications for HTN.  Patient is  regularly keeping a check on BP at home.  Adhering to low sodium diet: yes Exercising Regularly: Yes Denies headache, dizziness, CP, SHOB,  vision changes.    BP Readings from Last 3 Encounters:  01/18/24 130/81  10/16/16 138/88  10/10/16 179/98   Patient requesting to complete her physical today :  HEALTH SCREENINGS: - Vision Screening: up to date - Dental Visits: Recommended - Pap smear: Scheduled for July  - Breast Exam: Complete with OBGYN  - STD Screening: Declined - Mammogram (40+): Scheduled in July   - Colonoscopy (45+): Up to date  - Bone Density (65+ or under 21 with predisposing conditions): Not applicable  - Lung CA screening with low-dose CT:  Not applicable Adults age 67-80 who are current cigarette smokers or quit within the last 15 years. Must have 20 pack year history.   Depression and Anxiety Screen done today and results listed below:     01/18/2024    2:19 PM 10/16/2016    2:41 PM 10/16/2016    2:40 PM  Depression screen PHQ 2/9  Decreased Interest 3 0 0  Down, Depressed, Hopeless 3 1 1   PHQ - 2 Score 6 1 1   Altered sleeping 3  0  Tired, decreased energy 3  1  Change in appetite 0  0  Feeling bad or failure about yourself  1  0  Trouble concentrating 0  0  Moving slowly or fidgety/restless 0  0  Suicidal thoughts 0  0  PHQ-9 Score 13  2  Difficult doing work/chores Somewhat difficult        01/18/2024    2:21 PM  GAD 7 : Generalized Anxiety Score  Nervous, Anxious, on Edge 0  Control/stop worrying 1  Worry too much - different things 1  Trouble relaxing 1  Restless 0  Easily annoyed or irritable 0  Afraid - awful might happen 1  Total GAD 7 Score 4  Anxiety Difficulty Not difficult at all    IMMUNIZATIONS: - Tdap: Tetanus vaccination status reviewed: Recommended. - HPV: Not applicable - Influenza: Postponed to flu season - Pneumovax: Not applicable - Prevnar 20: Not applicable - Shingrix (50+): Recommend    Past medical history, surgical history, medications, allergies, family history and social history reviewed with patient today and changes made to appropriate areas of the  chart.   Past Medical History:  Diagnosis Date   Bilateral impacted cerumen 03/10/2019   Epistaxis, recurrent 03/10/2019   HTN (hypertension)    Hypothyroid    Iron deficiency anemia    Menometrorrhagia    Uterine fibroid     Past Surgical History:  Procedure Laterality Date   right thyroidectomy      Current Outpatient Medications on File Prior to Visit  Medication Sig   amLODipine (NORVASC) 10 MG tablet Take 10 mg by mouth daily.   BIOTIN PO Take by mouth.   Cyanocobalamin (VITAMIN B-12 PO) Take 5,000 mcg by mouth.   irbesartan (AVAPRO) 150 MG tablet Take 150 mg by mouth daily.   KERENDIA 20 MG TABS Take 1 tablet by mouth daily.   levothyroxine (SYNTHROID) 100 MCG tablet Take 100 mcg by mouth every morning.   No current facility-administered medications on file prior to visit.    No Known Allergies   Social History   Socioeconomic History   Marital status: Married    Spouse name: Not on file   Number of children: 4   Years of education: Not on file   Highest education level: Not on file  Occupational History   Occupation: Education administrator    Comment: Chief Strategy Officer at [employer]  Tobacco Use   Smoking status: Never   Smokeless tobacco: Never  Substance and Sexual Activity   Alcohol use: No   Drug use: No   Sexual activity: Yes  Other Topics Concern   Not on file  Social History Narrative   Not on file   Social Drivers of Health   Financial Resource Strain: Not on file  Food Insecurity: Not on file  Transportation Needs: Not on file  Physical Activity: Not on file  Stress: Not on file  Social Connections: Not on file  Intimate Partner Violence: Not on file   Social History   Tobacco Use  Smoking Status Never  Smokeless Tobacco Never   Social History   Substance and Sexual Activity  Alcohol Use No    Family History  Problem Relation Age of Onset   Hypertension Mother    Stroke Father    Hypertension Sister    Sickle  cell trait Sister    Hypertension Sister    Stroke Brother    Hypertension Brother    Diabetes Brother    Hypertension Maternal Grandfather    Sickle cell anemia Other      ROS: Denies fever, fatigue, unexplained weight loss/gain, chest pain, SHOB, and palpitations. Denies neurological deficits, gastrointestinal or genitourinary complaints, and skin changes.   Objective:   Today's Vitals   01/18/24 1412  BP: 130/81  Pulse: 95  SpO2: 98%  Weight: 170 lb (77.1 kg)  Height: 5' 3.5" (1.613 m)    GENERAL APPEARANCE: Well-appearing, in NAD. Well nourished.  SKIN: Pink, warm and dry. Turgor normal. No rash, lesion, ulceration, or ecchymoses. Hair evenly distributed.  HEENT: HEAD: Normocephalic.  EYES: PERRLA. EOMI. Lids intact w/o defect. Sclera white, Conjunctiva pink w/o exudate.  EARS: External ear w/o redness, swelling, masses or lesions. EAC clear. TM's intact, translucent w/o bulging, appropriate landmarks visualized. Appropriate acuity to conversational tones.  NOSE: Septum midline w/o deformity. Nares patent, mucosa pink and non-inflamed w/o drainage. No sinus tenderness.  THROAT: Uvula midline. Oropharynx clear. Tonsils non-inflamed w/o exudate. Oral mucosa pink and moist.  NECK: Supple, Trachea midline. Full ROM w/o pain or tenderness. No lymphadenopathy. Thyroid  non-tender w/o enlargement or palpable masses.  RESPIRATORY: Chest wall symmetrical w/o masses. Respirations even and non-labored. Breath sounds clear to auscultation bilaterally. No wheezes, rales, rhonchi, or crackles. CARDIAC: S1, S2 present, regular rate and rhythm. No gallops, murmurs, rubs, or clicks. PMI w/o lifts, heaves, or thrills. No carotid bruits. Capillary refill <2 seconds. Peripheral pulses 2+ bilaterally. GI: Abdomen soft w/o distention. Normoactive bowel sounds. No palpable masses or tenderness. No guarding or rebound tenderness. Liver and spleen w/o tenderness or enlargement. No CVA tenderness.  MSK:  Muscle tone and strength appropriate for age, w/o atrophy or abnormal movement.  EXTREMITIES: Active ROM intact, w/o tenderness, crepitus, or contracture. No obvious joint deformities or effusions. No clubbing, edema, or cyanosis.  NEUROLOGIC: CN's II-XII intact. Motor strength symmetrical with no obvious weakness. No sensory deficits. DTR's 2+ symmetric bilaterally. Steady, even gait.  PSYCH/MENTAL STATUS: Alert, oriented x 3. Cooperative, appropriate mood and affect.    Assessment & Plan:  1. Annual physical exam (Primary) Discussed preventative screenings , vaccines and will complete Mammo, Pap with OBGYN scheduled in the next 1-2 months. Discussed healthy lifestyle and regular exercise for prevention.  - CBC with Differential/Platelet; Future  2. Type 2 diabetes mellitus with chronic kidney disease, without long-term current use of insulin, unspecified CKD stage (HCC) Unknown if prediabetic or diabetic at this time. Will obtain records from prior PCP with patient release today. Discussed dietary control and will obtain A1C with labs.  - Hemoglobin A1c; Future  3. Acquired hypothyroidism Controlled per patient. Will obtain TSH with upcoming labs.  - TSH; Future  4. Screening for lipid disorders Controlled per patient. Will obtain fasting lipids with labwork.  - Lipid panel; Future  5. Chronic kidney disease, unspecified CKD stage Discussed renal diet and stages of CKD. Patient is not sure why she was started on Kerendia or stage of CKD. We discussed avoidance of nsaids, diet, and hydration. Will check renal function with labs and screen for anemia due to CKD with CBC.  - CBC with Differential/Platelet; Future - Comprehensive metabolic panel with GFR; Future   Orders Placed This Encounter  Procedures   CBC with Differential/Platelet    Standing Status:   Future    Number of Occurrences:   1    Expiration Date:   07/20/2024   Comprehensive metabolic panel with GFR    Standing  Status:   Future    Number of Occurrences:   1    Expiration Date:   07/20/2024   Lipid panel    Standing Status:   Future    Number of Occurrences:   1    Expiration Date:   07/20/2024   TSH    Standing Status:   Future    Number of Occurrences:   1    Expiration Date:   07/20/2024   Hemoglobin A1c    Standing Status:   Future    Number of Occurrences:   1    Expiration Date:   01/17/2025  PATIENT COUNSELING:  - Encouraged a healthy well-balanced diet. Patient may adjust caloric intake to maintain or achieve ideal body weight. May reduce intake of dietary saturated fat and total fat and have adequate dietary potassium and calcium preferably from fresh fruits, vegetables, and low-fat dairy products.   - Advised to avoid cigarette smoking. - Discussed with the patient that most people either abstain from alcohol or drink within safe limits (<=14/week and <=4 drinks/occasion for males, <=7/weeks and <= 3 drinks/occasion for females) and that the risk for alcohol disorders and other health effects rises proportionally with the number of drinks per week and how often a drinker exceeds daily limits. - Discussed cessation/primary prevention of drug use and availability of treatment for abuse.  - Discussed sexually transmitted diseases, avoidance of unintended pregnancy and contraceptive alternatives.  - Stressed the importance of regular exercise - Injury prevention: Discussed safety belts, safety helmets, smoke detector, smoking near bedding or upholstery.  - Dental health: Discussed importance of regular tooth brushing, flossing, and dental visits.   NEXT PREVENTATIVE PHYSICAL DUE IN 1 YEAR.  Return in about 3 months (around 04/19/2024) for Follow up CKD, DM, HTN.  Patient to reach out to office if new, worrisome, or unresolved symptoms arise or if no improvement in patient's condition. Patient verbalized understanding and is agreeable to treatment plan. All questions answered to  patient's satisfaction.    Nonda Bays, [place]

## 2024-01-18 NOTE — Telephone Encounter (Signed)
 Monica Velazquez, please advise.

## 2024-01-18 NOTE — Telephone Encounter (Signed)
 Copied from CRM [id number]. Topic: Clinical - Medical Advice >> Jan 18, 2024  4:11 PM Monica Velazquez wrote: Reason for CRM: forgot to mention at appt- having cramps in feet and also frequent nose bleeds- please call [phone]

## 2024-01-19 ENCOUNTER — Encounter (HOSPITAL_BASED_OUTPATIENT_CLINIC_OR_DEPARTMENT_OTHER): Payer: Self-pay | Admitting: Family Medicine

## 2024-01-19 ENCOUNTER — Other Ambulatory Visit (HOSPITAL_BASED_OUTPATIENT_CLINIC_OR_DEPARTMENT_OTHER): Payer: Self-pay | Admitting: *Deleted

## 2024-01-19 ENCOUNTER — Encounter (HOSPITAL_BASED_OUTPATIENT_CLINIC_OR_DEPARTMENT_OTHER): Payer: Self-pay | Admitting: *Deleted

## 2024-01-19 ENCOUNTER — Other Ambulatory Visit (HOSPITAL_COMMUNITY): Payer: Self-pay | Admitting: Family Medicine

## 2024-01-19 DIAGNOSIS — E039 Hypothyroidism, unspecified: Secondary | ICD-10-CM

## 2024-01-19 DIAGNOSIS — E1122 Type 2 diabetes mellitus with diabetic chronic kidney disease: Secondary | ICD-10-CM

## 2024-01-19 DIAGNOSIS — N189 Chronic kidney disease, unspecified: Secondary | ICD-10-CM

## 2024-01-19 DIAGNOSIS — Z0000 Encounter for general adult medical examination without abnormal findings: Secondary | ICD-10-CM

## 2024-01-19 DIAGNOSIS — Z13220 Encounter for screening for lipoid disorders: Secondary | ICD-10-CM

## 2024-01-20 LAB — COMPREHENSIVE METABOLIC PANEL WITH GFR
ALT: 17 IU/L (ref 0–32)
AST: 12 IU/L (ref 0–40)
Albumin: 4.4 g/dL (ref 3.9–4.9)
Alkaline Phosphatase: 109 IU/L (ref 44–121)
BUN/Creatinine Ratio: 16 (ref 12–28)
BUN: 11 mg/dL (ref 8–27)
Bilirubin Total: 0.3 mg/dL (ref 0.0–1.2)
CO2: 25 mmol/L (ref 20–29)
Calcium: 9.8 mg/dL (ref 8.7–10.3)
Chloride: 103 mmol/L (ref 96–106)
Creatinine, Ser: 0.67 mg/dL (ref 0.57–1.00)
Globulin, Total: 2.5 g/dL (ref 1.5–4.5)
Glucose: 130 mg/dL — ABNORMAL HIGH (ref 70–99)
Potassium: 4.5 mmol/L (ref 3.5–5.2)
Sodium: 141 mmol/L (ref 134–144)
Total Protein: 6.9 g/dL (ref 6.0–8.5)
eGFR: 99 mL/min/{1.73_m2} (ref >59)

## 2024-01-20 LAB — CBC WITH DIFFERENTIAL/PLATELET
Basophils Absolute: 0 10*3/uL (ref 0.0–0.2)
Basos: 1 %
EOS (ABSOLUTE): 0.2 10*3/uL (ref 0.0–0.4)
Eos: 3 %
Hematocrit: 39.4 % (ref 34.0–46.6)
Hemoglobin: 13 g/dL (ref 11.1–15.9)
Immature Grans (Abs): 0 10*3/uL (ref 0.0–0.1)
Immature Granulocytes: 0 %
Lymphocytes Absolute: 1.6 10*3/uL (ref 0.7–3.1)
Lymphs: 24 %
MCH: 29.7 pg (ref 26.6–33.0)
MCHC: 33 g/dL (ref 31.5–35.7)
MCV: 90 fL (ref 79–97)
Monocytes Absolute: 0.5 10*3/uL (ref 0.1–0.9)
Monocytes: 7 %
Neutrophils Absolute: 4.5 10*3/uL (ref 1.4–7.0)
Neutrophils: 65 %
Platelets: 224 10*3/uL (ref 150–450)
RBC: 4.38 x10E6/uL (ref 3.77–5.28)
RDW: 12.2 % (ref 11.7–15.4)
WBC: 6.7 10*3/uL (ref 3.4–10.8)

## 2024-01-20 LAB — LIPID PANEL
Chol/HDL Ratio: 2.8 ratio (ref 0.0–4.4)
Cholesterol, Total: 159 mg/dL (ref 100–199)
HDL: 57 mg/dL (ref >39)
LDL Chol Calc (NIH): 91 mg/dL (ref 0–99)
Triglycerides: 56 mg/dL (ref 0–149)
VLDL Cholesterol Cal: 11 mg/dL (ref 5–40)

## 2024-01-20 LAB — TSH: TSH: 2.25 u[IU]/mL (ref 0.450–4.500)

## 2024-01-20 LAB — HEMOGLOBIN A1C
Est. average glucose Bld gHb Est-mCnc: 137 mg/dL
Hgb A1c MFr Bld: 6.4 % — ABNORMAL HIGH (ref 4.8–5.6)

## 2024-01-26 ENCOUNTER — Ambulatory Visit (HOSPITAL_BASED_OUTPATIENT_CLINIC_OR_DEPARTMENT_OTHER): Payer: Self-pay | Admitting: Family Medicine

## 2024-01-26 NOTE — Progress Notes (Signed)
 Hi Monica Velazquez,  I am still waiting for your primary care records to compare these values to. Overall, your kidney function is normal and your electrolytes and liver enzymes are normal. Your blood counts are normal and show no signs of anemia. Your cholesterol is very well controlled and your thyroid  is well controlled with no medication adjustment needed. Your A1C is in the prediabetic range and on the border of diabetes. I would still like to see your previous labs to see your kidney function and if you are in the prediabetic or diabetic range.

## 2024-02-02 ENCOUNTER — Encounter (HOSPITAL_BASED_OUTPATIENT_CLINIC_OR_DEPARTMENT_OTHER): Payer: Self-pay | Admitting: Family Medicine

## 2024-02-02 DIAGNOSIS — E1129 Type 2 diabetes mellitus with other diabetic kidney complication: Secondary | ICD-10-CM | POA: Insufficient documentation

## 2024-02-02 DIAGNOSIS — E1122 Type 2 diabetes mellitus with diabetic chronic kidney disease: Secondary | ICD-10-CM | POA: Insufficient documentation

## 2024-02-11 ENCOUNTER — Ambulatory Visit (HOSPITAL_COMMUNITY)
Admission: RE | Admit: 2024-02-11 | Discharge: 2024-02-11 | Disposition: A | Discharge status: Home / Self Care | Source: Ambulatory Visit | Attending: Family Medicine | Admitting: Family Medicine

## 2024-02-11 ENCOUNTER — Ambulatory Visit (INDEPENDENT_AMBULATORY_CARE_PROVIDER_SITE_OTHER)

## 2024-02-11 ENCOUNTER — Encounter (HOSPITAL_COMMUNITY): Payer: Self-pay

## 2024-02-11 VITALS — BP 143/85 | HR 88 | Temp 98.1°F | Resp 16 | Ht 63.0 in | Wt 170.0 lb

## 2024-02-11 DIAGNOSIS — R0600 Dyspnea, unspecified: Secondary | ICD-10-CM | POA: Diagnosis present

## 2024-02-11 DIAGNOSIS — R600 Localized edema: Secondary | ICD-10-CM | POA: Insufficient documentation

## 2024-02-11 LAB — POCT URINALYSIS DIP (MANUAL ENTRY)
Bilirubin, UA: NEGATIVE
Blood, UA: NEGATIVE
Glucose, UA: NEGATIVE mg/dL
Ketones, POC UA: NEGATIVE mg/dL
Leukocytes, UA: NEGATIVE
Nitrite, UA: NEGATIVE
Protein Ur, POC: NEGATIVE mg/dL
Spec Grav, UA: 1.02 (ref 1.010–1.025)
Urobilinogen, UA: 0.2 U/dL
pH, UA: 7 (ref 5.0–8.0)

## 2024-02-11 LAB — COMPREHENSIVE METABOLIC PANEL WITH GFR
ALT: 11 U/L (ref 0–44)
AST: 22 U/L (ref 15–41)
Albumin: 4.5 g/dL (ref 3.5–5.0)
Alkaline Phosphatase: 96 U/L (ref 38–126)
Anion gap: 13 (ref 5–15)
BUN: 10 mg/dL (ref 8–23)
CO2: 25 mmol/L (ref 22–32)
Calcium: 10.1 mg/dL (ref 8.9–10.3)
Chloride: 100 mmol/L (ref 98–111)
Creatinine, Ser: 0.57 mg/dL (ref 0.44–1.00)
GFR, Estimated: >60 mL/min (ref >60)
Glucose, Bld: 75 mg/dL (ref 70–99)
Potassium: 4.7 mmol/L (ref 3.5–5.1)
Sodium: 138 mmol/L (ref 135–145)
Total Bilirubin: 1 mg/dL (ref 0.0–1.2)
Total Protein: 8 g/dL (ref 6.5–8.1)

## 2024-02-11 NOTE — ED Provider Notes (Signed)
 [facility]    CSN: [id number] Arrival date & time: 02/11/24  1523      History   Chief Complaint Chief Complaint  Patient presents with   Extremity Pain    Both legs are hurting and it's hard to walk there's pain in my right ankle. I want to make sure there's no fluid - Entered by patient    HPI Monica Velazquez is a 63 y.o. female.    Extremity Pain  Swelling both lower extremities associated with feeling of tightness in legs and knees, gradual onset 2 weeks ago, constant.  Also having some right ankle pain.  No palliative or provocative factors.  Admits dyspnea on exertion x 2 weeks as well. Denies chest pain, palpitations, cough, fever, chills, sweats. Patient had new patient visit with PCP 01/18/2024, basic labs were done 01/19/2024 including CBC CMP lipid panel hemoglobin A1c and a TSH, globin A1c is elevated at 6.4, glucose was 130 rest of labs were unremarkable.  Her new PCP obtained records from her prior PCP and told her she could stop her Kerendia. She is a smoker.  Denies new over-the-counter supplements  Past Medical History:  Diagnosis Date   Bilateral impacted cerumen 03/10/2019   Epistaxis, recurrent 03/10/2019   HTN (hypertension)    Hypothyroid    Iron deficiency anemia    Menometrorrhagia    Uterine fibroid     Patient Active Problem List   Diagnosis Date Noted   Microalbuminuria due to type 2 diabetes mellitus (HCC) 02/02/2024   Type 2 diabetes mellitus with microalbuminuria, without long-term current use of insulin (HCC) 02/02/2024   Seborrheic keratosis 11/24/2016   HTN (hypertension)    Hypothyroid    Iron deficiency anemia     Past Surgical History:  Procedure Laterality Date   THYROIDECTOMY, PARTIAL Right 02/13/2004    OB History   No obstetric history on file.      Home Medications    Prior to Admission medications   Medication Sig Start Date End Date Taking? Authorizing Provider  amLODipine (NORVASC) 10 MG tablet Take  10 mg by mouth daily. 01/29/19  Yes [provider]  BIOTIN PO Take by mouth.   Yes [provider]  Cyanocobalamin (VITAMIN B-12 PO) Take 5,000 mcg by mouth.   Yes [provider]  irbesartan (AVAPRO) 150 MG tablet Take 150 mg by mouth daily. 12/24/23  Yes [provider]  KERENDIA 20 MG TABS Take 1 tablet by mouth daily. 12/07/23  Yes [provider]  levothyroxine (SYNTHROID) 100 MCG tablet Take 100 mcg by mouth every morning. 11/05/23  Yes [provider]    Family History Family History  Problem Relation Age of Onset   Hypertension Mother    Breast cancer Mother 52   Stroke Father    Hypertension Sister    Sickle cell trait Sister    Hypertension Sister    Stroke Brother    Hypertension Brother    Diabetes Brother    Hypertension Maternal Grandfather    Sickle cell anemia Other     Social History Social History   Tobacco Use   Smoking status: Never   Smokeless tobacco: Never  Vaping Use   Vaping status: Never Used  Substance Use Topics   Alcohol use: No   Drug use: No     Allergies   Patient has no known allergies.   Review of Systems Review of Systems   Physical Exam Triage Vital Signs ED Triage Vitals  Encounter Vitals Group     BP 02/11/24 1548 (!) 143/85     Girls Systolic BP Percentile --      Girls Diastolic BP Percentile --      Boys Systolic BP Percentile --      Boys Diastolic BP Percentile --      Pulse Rate 02/11/24 1548 88     Resp 02/11/24 1548 16     Temp 02/11/24 1548 98.1 F (36.7 C)     Temp Source 02/11/24 1548 Oral     SpO2 02/11/24 1548 95 %     Weight 02/11/24 1547 170 lb (77.1 kg)     Height 02/11/24 1547 5' 3 (1.6 m)     Head Circumference --      Peak Flow --      Pain Score 02/11/24 1547 3     Pain Loc --      Pain Education --      Exclude from Growth Chart --    No data found.  Updated Vital Signs BP (!) 143/85 (BP Location: Left Arm)   Pulse 88   Temp 98.1 F  (36.7 C) (Oral)   Resp 16   Ht 5' 3 (1.6 m)   Wt 170 lb (77.1 kg)   SpO2 95%   BMI 30.11 kg/m   Visual Acuity Right Eye Distance:   Left Eye Distance:   Bilateral Distance:    Right Eye Near:   Left Eye Near:    Bilateral Near:     Physical Exam Vitals and nursing note reviewed.  Constitutional:      Appearance: She is obese.  HENT:     Head: Normocephalic and atraumatic.     Mouth/Throat:     Mouth: Mucous membranes are moist.   Eyes:     Conjunctiva/sclera: Conjunctivae normal.    Cardiovascular:     Rate and Rhythm: Normal rate and regular rhythm.     Heart sounds: Normal heart sounds.  Pulmonary:     Breath sounds: Normal breath sounds.  Abdominal:     Palpations: Abdomen is soft.     Tenderness: There is no abdominal tenderness.   Musculoskeletal:        General: No tenderness.     Right lower leg: Edema present.     Left lower leg: Edema present.     Comments: No calf tenderness no erythema no increased heat no palpable cords   Skin:    General: Skin is warm and dry.   Neurological:     Mental Status: She is alert and oriented to person, place, and time.   Psychiatric:        Mood and Affect: Mood normal.      UC Treatments / Results  Labs (all labs ordered are listed, but only abnormal results are displayed) Labs Reviewed  COMPREHENSIVE METABOLIC PANEL WITH GFR  POCT URINALYSIS DIP (MANUAL ENTRY)    EKG   Radiology DG Chest 2 View Result Date: 02/11/2024 CLINICAL DATA:  Shortness of breath EXAM: CHEST - 2 VIEW COMPARISON:  05/04/2009 FINDINGS: The heart size and mediastinal contours are within normal limits. Both lungs are clear. The visualized skeletal structures are unremarkable. IMPRESSION: No active cardiopulmonary disease. Electronically Signed   By: Violeta Grey M.D.   On: 02/11/2024 16:39    Procedures Procedures (including critical care time)  Medications Ordered in UC Medications - No data to display  Initial Impression  / Assessment and Plan / UC Course  I have reviewed the triage vital signs and the nursing notes.  Pertinent labs & imaging results that were available during my care of the patient were reviewed by me and considered in my medical decision making (see chart for details).     63 year old female presents with bilateral lower extremity swelling x 2 weeks gradual onset, constant associated with dyspnea on exertion.  She just had a visit with her new PCP 2 weeks ago had basic lab work which was normal except for hemoglobin A1c of 6.4.  She was instructed to stop her Kerendia which she took for protein in her urine  Chest x-ray normal, EKG normal sinus rhythm with a rate of 82 normal axis normal intervals no acute ischemic changes. Will check CMP to see if there is any change in her kidney function or protein.  Recommend she get back on her [place] Goes, elevate her legs and wear compression hose, all her PCP Monday to schedule a recheck, go to ED for new worsening symptoms or concerns Final Clinical Impressions(s) / UC Diagnoses   Final diagnoses:  Dyspnea, unspecified type  Pedal edema     Discharge Instructions      Take your Gable Johann as previously prescribed.  Continue your other medications.  Call your primary care doctor on Monday to discuss your symptoms.  Elevate your legs and wear compression hose.  Test results will be released to your MyChart account We will contact you if anything is positive and requires treatment.   To the emergency department if you develop new symptoms such as chest pain, worsening shortness of breath, concerns     ED Prescriptions   None    PDMP not reviewed this encounter.   Katheline Brendlinger, [place] 02/11/24 1721

## 2024-02-11 NOTE — ED Triage Notes (Signed)
 Pt states that she has some tightness of both legs and knees.  Pt states that she also has some pain of her right ankle x2 weeks

## 2024-02-11 NOTE — Discharge Instructions (Addendum)
 Take your Gable Johann as previously prescribed.  Continue your other medications.  Call your primary care doctor on Monday to discuss your symptoms.  Elevate your legs and wear compression hose.  Test results will be released to your MyChart account We will contact you if anything is positive and requires treatment.   To the emergency department if you develop new symptoms such as chest pain, worsening shortness of breath, concerns

## 2024-02-14 ENCOUNTER — Ambulatory Visit (HOSPITAL_COMMUNITY): Payer: Self-pay

## 2024-02-18 NOTE — Telephone Encounter (Signed)
 Monica Velazquez pt. Dr. De Peru, please see most recent mychart message sent by pt and advise.

## 2024-02-21 ENCOUNTER — Telehealth (HOSPITAL_BASED_OUTPATIENT_CLINIC_OR_DEPARTMENT_OTHER): Payer: Self-pay | Admitting: Family Medicine

## 2024-02-21 NOTE — Telephone Encounter (Signed)
 LVM for patient to call the office to schedule an appointment.   Mychart msg sent to patient

## 2024-02-21 NOTE — Telephone Encounter (Unsigned)
 Copied from CRM [id number]. Topic: General - Call Back - No Documentation >> Feb 21, 2024  9:55 AM Donee H wrote: Reason for CRM: Patient stated received a call from someone name Freddy and was returning call to see what it was about. No documentation left to provide patient. Requesting callback at [phone]

## 2024-02-21 NOTE — Telephone Encounter (Signed)
 Triage nurse reviewed documentation/CRM & chart to assist with locating staff whom called pt.  Triage nurses did not attempt to reach patient therefore routing to office for assistance and follow up with patient.    Copied from CRM [id number]. Topic: General - Call Back - No Documentation >> Feb 21, 2024  9:55 AM Donee H wrote: Reason for CRM: Patient stated received a call from someone name Freddy and was returning call to see what it was about. No documentation left to provide patient. Requesting callback at [phone]

## 2024-02-22 NOTE — Telephone Encounter (Signed)
 Patient scheduled for follow up with Thersia 7/3 per notes in chart

## 2024-03-02 ENCOUNTER — Ambulatory Visit (HOSPITAL_BASED_OUTPATIENT_CLINIC_OR_DEPARTMENT_OTHER): Admitting: Family Medicine

## 2024-04-19 ENCOUNTER — Ambulatory Visit (INDEPENDENT_AMBULATORY_CARE_PROVIDER_SITE_OTHER): Payer: Self-pay | Admitting: Family Medicine

## 2024-04-19 ENCOUNTER — Encounter (HOSPITAL_BASED_OUTPATIENT_CLINIC_OR_DEPARTMENT_OTHER): Payer: Self-pay | Admitting: Family Medicine

## 2024-04-19 VITALS — BP 148/90 | HR 80 | Temp 98.5°F | Ht 63.0 in | Wt 170.0 lb

## 2024-04-19 DIAGNOSIS — I10 Essential (primary) hypertension: Secondary | ICD-10-CM

## 2024-04-19 DIAGNOSIS — R2242 Localized swelling, mass and lump, left lower limb: Secondary | ICD-10-CM | POA: Insufficient documentation

## 2024-04-19 DIAGNOSIS — E039 Hypothyroidism, unspecified: Secondary | ICD-10-CM

## 2024-04-19 DIAGNOSIS — E1129 Type 2 diabetes mellitus with other diabetic kidney complication: Secondary | ICD-10-CM

## 2024-04-19 DIAGNOSIS — R809 Proteinuria, unspecified: Secondary | ICD-10-CM

## 2024-04-19 LAB — POCT UA - MICROALBUMIN
Albumin/Creatinine Ratio, Urine, POC: <30
Creatinine, POC: 200 mg/dL
Microalbumin Ur, POC: 80 mg/L

## 2024-04-19 MED ORDER — LEVOTHYROXINE SODIUM 100 MCG PO TABS: 100.0000 ug | ORAL_TABLET | Freq: Every morning | ORAL | 3 refills | Status: AC

## 2024-04-19 MED ORDER — IRBESARTAN-HYDROCHLOROTHIAZIDE 150-12.5 MG PO TABS: 1.0000 | ORAL_TABLET | Freq: Every day | ORAL | 3 refills | Status: DC

## 2024-04-19 NOTE — Patient Instructions (Addendum)
 Vitamin B12 1000mcg per day (over the counter)  Stop Amlodipine and Irbesartan  150mg    Start Irbesartan -hydrochlorothiazide  150-12.5mg  daily    Schedule vascular ultrasound of left lower leg

## 2024-04-19 NOTE — Progress Notes (Signed)
 Subjective:   Monica Velazquez 06-07-1961 04/19/2024  Chief Complaint  Patient presents with   Follow-up    Patient states she here for a follow up on her left knee issues. Patient states she been having a lot of swelling and would like to discuss her blood pressure Rx.    Discussed the use of AI scribe software for clinical note transcription with the patient, who gave verbal consent to proceed.  History of Present Illness Monica Velazquez is a 63 year old female with hypertension who presents with left knee pain and swelling.   LLE SWELLING:  She experiences significant stiffness and pain in her left knee, which is persistent and uncomfortable. The pain is localized to the knee but can radiate through the leg, especially at night, causing difficulty in finding a comfortable sleeping position. She has been taking Tylenol to manage the pain.She visited the ER due to concerns about swelling in her left knee, but no significant findings were reported. Swelling has been present since at least January, with the left leg being more affected than the right. She adjusted her blood pressure medication, amlodipine, to nighttime dosing to see if it would alleviate the swelling, which has shown some improvement but not complete resolution. She report swelling is now only present in her LLE. 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    HYPERTENSION: She has a history of hypertension and takes amlodipine. She recently ran out of her blood pressure medication for the past 2 days which may have contributed to elevated readings today. She has been experiencing frequent epistaxis for about a year, initially attributed to dryness but now wonders if it might be related to her blood pressure.      HYPOTHYROIDISM: Patient presents for the medical management of Hypothyroidism. Current medication: levothyroxine  100mcg  Patient compliant with medication  regimen: yes  Fatigue: no Cold intolerance: no Weight gain/loss: no Constipation: no Lower extremity edema: no Palpitations: no Hoarseness: no Neck Pain/Compression: no Difficulty Swallowing: no  Lab Results  Component Value Date   TSH 2.250 01/19/2024    HYPERTENSION: ANJULIE DIPIERRO presents for the medical management of hypertension.  Patient's current hypertension medication regimen is: Irbesartan  150mg , Amlodipine 10mg   Patient is  currently taking prescribed medications for HTN.  Patient is  regularly keeping a check on BP at home.  Adhering to low sodium diet: Yes Exercising Regularly: Yes Denies headache, dizziness, CP, SHOB, vision changes.   BP Readings from Last 3 Encounters:  04/19/24 (!) 148/90  02/11/24 (!) 143/85  01/18/24 130/81     DIABETES MELLITUS with MICROALBUMINURIA : Monica Velazquez presents for the medical management of diabetes.She has previously been on Kerendia due to microalbuminuria. Per chart review, there was no indication for continuation of Kerendia with presence of increased microalbumin. Patient stopped taking her Kerendia and denies change in urination. Her last GFR 99 on 01/19/2024.   Current diabetes medication regimen: Diet controlled.  Patient is  adhering to a diabetic diet.  Patient is  exercising regularly.  Patient is not checking BS regularly. Patient is  checking their feet regularly.  Denies polydipsia, polyphagia, polyuria, open wounds or ulcers on feet.   Lab Results  Component Value Date   HGBA1C 6.4 (H) 01/19/2024   Lab Results  Component Value Date   MICROALBUR 80 04/19/2024    Wt Readings from Last 3 Encounters:  04/19/24 170 lb (77.1 kg)  02/11/24 170 lb (77.1 kg)  01/18/24 170 lb (77.1 kg)      The following portions of the patient's history were reviewed and updated as appropriate: past medical history, past surgical history, family history, social history, allergies, medications, and problem list.    Patient Active Problem List   Diagnosis Date Noted   Microalbuminuria due to type 2 diabetes mellitus (HCC) 02/02/2024   Type 2 diabetes mellitus with microalbuminuria, without long-term current use of insulin (HCC) 02/02/2024   Seborrheic keratosis 11/24/2016   HTN (hypertension)    Hypothyroid    Iron deficiency anemia    Past Medical History:  Diagnosis Date   Bilateral impacted cerumen 03/10/2019   Epistaxis, recurrent 03/10/2019   HTN (hypertension)    Hypothyroid    Iron deficiency anemia    Menometrorrhagia    Uterine fibroid    Past Surgical History:  Procedure Laterality Date   THYROIDECTOMY, PARTIAL Right 02/13/2004   Family History  Problem Relation Age of Onset   Hypertension Mother    Breast cancer Mother 88   Stroke Father    Hypertension Sister    Sickle cell trait Sister    Hypertension Sister    Stroke Brother    Hypertension Brother    Diabetes Brother    Hypertension Maternal Grandfather    Sickle cell anemia Other    Outpatient Medications Prior to Visit  Medication Sig Dispense Refill   BIOTIN PO Take by mouth.     amLODipine (NORVASC) 10 MG tablet Take 10 mg by mouth daily.     Cyanocobalamin (VITAMIN B-12 PO) Take 5,000 mcg by mouth.     irbesartan  (AVAPRO ) 150 MG tablet Take 150 mg by mouth daily.     levothyroxine  (SYNTHROID ) 100 MCG tablet Take 100 mcg by mouth every morning.     KERENDIA 20 MG TABS Take 1 tablet by mouth daily. (Patient not taking: Reported on 04/19/2024)     No facility-administered medications prior to visit.   No Known Allergies   ROS: A complete ROS was performed with pertinent positives/negatives noted in the HPI. The remainder of the ROS are negative.    Objective:   Today's Vitals   04/19/24 1548 04/19/24 1625  BP: (!) 158/99 (!) 148/90  Pulse: 80   Temp: 98.5 F (36.9 C)   SpO2: 98%   Weight: 170 lb (77.1 kg)   Height: 5' 3 (1.6 m)     Physical Exam EXTREMITIES: Left leg more swollen than right.  No warmth or redness in legs.  GENERAL: Well-appearing, in NAD. Well nourished.  SKIN: Pink, warm and dry. No rash, lesion, ulceration, or ecchymoses.  Head: Normocephalic. NECK: Trachea midline. Full ROM w/o pain or tenderness. RESPIRATORY: Chest wall symmetrical. Respirations even and non-labored.  CARDIAC:  Peripheral pulses 2+ bilaterally.  MSK: Muscle tone and strength appropriate for age. Joints w/o tenderness, redness, or swelling.  EXTREMITIES: Without clubbing, cyanosis. +1 non pitting edema present to LLE.  +3 pedal and posterior tibial pulses bilaterally.  NEUROLOGIC: No motor or sensory deficits. Steady, even gait. C2-C12 intact.  PSYCH/MENTAL STATUS: Alert, oriented x 3. Cooperative, appropriate mood and affect.    Results for orders placed or performed in visit on 04/19/24  POCT UA - Microalbumin  Result Value Ref Range   Microalbumin Ur, POC 80 mg/L   Creatinine, POC 200 mg/dL   Albumin/Creatinine Ratio, Urine, POC <30     The 10-year ASCVD risk score (Arnett DK,  et al., 2019) is: 20.3%     Assessment & Plan:  1. Benign essential HTN (Primary) Discussed amlodipine may be contributing to swelling and patient desring to change medication. Will start Irbesartan -hydrochlorothiazide  150-12.5 pending renal function with labs today. Patient to stay well hydated. Return in 4 weeks for BP and labs.  - irbesartan -hydrochlorothiazide  (AVALIDE) 150-12.5 MG tablet; Take 1 tablet by mouth daily.  Dispense: 30 tablet; Refill: 3 - Basic Metabolic Panel (BMET) - Basic Metabolic Panel (BMET); Future  2. Type 2 diabetes mellitus with microalbuminuria, without long-term current use of insulin (HCC) Previously diet controlled. Will return for A1C in 4 weeks due to insurance coverage. Discussed possibility of retsarting Kerendia pending A1C and renal function with improved control of hypertension. We discussed multifactorial causes of microalbuminuria. Will repeat urine albumin in 4 weeks  with repeat BP check and labs.   - POCT UA - Microalbumin - Basic Metabolic Panel (BMET); Future - Urine Microalbumin w/creat. ratio; Future  3. Localized swelling of left lower leg Possible venous stasis given duration and unilateral swelling. No acute signs of DVT at this time Will obtain venous study of left lower extremity. Discussed use of compression stockings with patient as well.  - VAS US  LOWER EXTREMITY VENOUS (DVT); Future  4. Acquired hypothyroidism Stable. Will continue to monitor and continue current dosage of levothyroxine .   Meds ordered this encounter  Medications   irbesartan -hydrochlorothiazide  (AVALIDE) 150-12.5 MG tablet    Sig: Take 1 tablet by mouth daily.    Dispense:  30 tablet    Refill:  3    Supervising Provider:   DE PERU, RAYMOND J [[id number]]   levothyroxine  (SYNTHROID ) 100 MCG tablet    Sig: Take 1 tablet (100 mcg total) by mouth every morning.    Dispense:  90 tablet    Refill:  3    Supervising Provider:   DE PERU, RAYMOND J [[id number]]   Lab Orders         Basic Metabolic Panel (BMET)         POCT UA - Microalbumin     No images are attached to the encounter or orders placed in the encounter.  Return for 4 weeks- Lab and BP Check Only; 3 months AE and Chronic F/u .    Patient to reach out to office if new, worrisome, or unresolved symptoms arise or if no improvement in patient's condition. Patient verbalized understanding and is agreeable to treatment plan. All questions answered to patient's satisfaction.    Thersia Schuyler Stark, [place]

## 2024-04-20 ENCOUNTER — Telehealth (HOSPITAL_COMMUNITY): Payer: Self-pay

## 2024-04-20 ENCOUNTER — Ambulatory Visit (HOSPITAL_BASED_OUTPATIENT_CLINIC_OR_DEPARTMENT_OTHER): Payer: Self-pay | Admitting: Family Medicine

## 2024-04-20 LAB — BASIC METABOLIC PANEL WITH GFR
BUN/Creatinine Ratio: 18 (ref 12–28)
BUN: 13 mg/dL (ref 8–27)
CO2: 24 mmol/L (ref 20–29)
Calcium: 10 mg/dL (ref 8.7–10.3)
Chloride: 99 mmol/L (ref 96–106)
Creatinine, Ser: 0.72 mg/dL (ref 0.57–1.00)
Glucose: 88 mg/dL (ref 70–99)
Potassium: 4 mmol/L (ref 3.5–5.2)
Sodium: 139 mmol/L (ref 134–144)
eGFR: 94 mL/min/1.73 (ref >59)

## 2024-04-20 NOTE — Telephone Encounter (Signed)
 04/19/24 Request entered for patient to have ultrasound 04/20/24 Request reviewed by admin assist No auth info noted, message sent to central referrals for processing auth. Message sent to provider requesting information for urgency of study - is request STAT/ASAP?

## 2024-04-20 NOTE — Progress Notes (Signed)
 Hi Fryda, Your kidney function is stable and electrolytes are within normal limits.  You can go ahead and start the new blood pressure medicine that we discussed.  When you return for blood pressure check and lab work, we will also obtain her urine to check for protein as this may be impacted by uncontrolled blood pressure.  Let me know if you have any further questions

## 2024-04-21 ENCOUNTER — Ambulatory Visit (HOSPITAL_COMMUNITY)
Admission: RE | Admit: 2024-04-21 | Discharge: 2024-04-21 | Disposition: A | Discharge status: Home / Self Care | Source: Ambulatory Visit | Attending: Family Medicine | Admitting: Family Medicine

## 2024-04-21 DIAGNOSIS — R2242 Localized swelling, mass and lump, left lower limb: Secondary | ICD-10-CM | POA: Insufficient documentation

## 2024-04-25 NOTE — Progress Notes (Signed)
 Hi Monica Velazquez, Your ultrasound of your lower extremities does not show any type of blood clot or obstruction that could be contributing to the swelling.  There was no cyst present in the back of the knee either that can sometimes contribute to swelling.  If you are still interested in discussing possible treatments such as sclerotherapy with vascular, I be happy to place this referral for you.  Please let me know

## 2024-05-03 ENCOUNTER — Ambulatory Visit: Payer: Self-pay

## 2024-05-03 NOTE — Telephone Encounter (Signed)
 FYI Only or Action Required?: FYI only for provider.  Patient was last seen in primary care on 04/19/2024 by Knute Thersia Bitters, FNP.  Called Nurse Triage reporting Leg Pain.  Symptoms began several months ago.  Interventions attempted: OTC medications: ibuprofen and Aspercreme and Ice/heat application.  Symptoms are: gradually worsening.  Triage Disposition: See Physician Within 24 Hours (overriding See HCP Within 4 Hours (Or PCP Triage))  Patient/caregiver understands and will follow disposition?: Yes                             Copied from CRM #[id number]. Topic: Clinical - Red Word Triage >> May 03, 2024  4:43 PM Selinda RAMAN wrote: Red Word that prompted transfer to Nurse Triage: The patient called in stating she has been having severe leg pain. She recently went for an U/S to check for a blood blood but it was negative for a DVT. She was relieved but doesn't understand why she is still hurting so bad. She said she has tried ibuprofen and tylenol, Aspercreme and even patches with no relief. I will transfer her to E2C2 NT Reason for Disposition  [1] SEVERE pain (e.g., excruciating, unable to walk) AND [2] not improved after 2 hours of pain medicine  Answer Assessment - Initial Assessment Questions 1. LOCATION and RADIATION: Where is the pain located?      Left knee 2. QUALITY: What does the pain feel like?  (e.g., sharp, dull, aching, burning)     Pain feels like a toothache, throbbing  3. SEVERITY: How bad is the pain? What does it keep you from doing?   (Scale 1-10; or mild, moderate, severe)     Rates pain a 10 4. ONSET: When did the pain start? Does it come and go, or is it there all the time?     6 months ago and worsening 5. RECURRENT: Have you had this pain before? If Yes, ask: When, and what happened then?     Denies 6. SETTING: Has there been any recent work, exercise or other activity that involved that part of the body?       Denies  7. AGGRAVATING FACTORS: What makes the knee pain worse? (e.g., walking, climbing stairs, running)     Standing up and walking  8. ASSOCIATED SYMPTOMS: Is there any swelling or redness of the knee?     Swelling, denies redness 9. OTHER SYMPTOMS: Do you have any other symptoms? (e.g., calf pain, chest pain, difficulty breathing, fever)     States she is having to limp to walk, denies difficulty breathing, denies chest pain, denies fever    Patient stated she has already received an ultrasound to rule out DVT.  Protocols used: Knee Pain-A-AH

## 2024-05-04 ENCOUNTER — Encounter (HOSPITAL_BASED_OUTPATIENT_CLINIC_OR_DEPARTMENT_OTHER): Payer: Self-pay | Admitting: Family Medicine

## 2024-05-04 ENCOUNTER — Ambulatory Visit (HOSPITAL_BASED_OUTPATIENT_CLINIC_OR_DEPARTMENT_OTHER): Admitting: Family Medicine

## 2024-05-04 ENCOUNTER — Ambulatory Visit (INDEPENDENT_AMBULATORY_CARE_PROVIDER_SITE_OTHER)

## 2024-05-04 VITALS — BP 136/88 | HR 85 | Ht 63.0 in | Wt 172.0 lb

## 2024-05-04 DIAGNOSIS — M25562 Pain in left knee: Secondary | ICD-10-CM | POA: Diagnosis not present

## 2024-05-04 DIAGNOSIS — G8929 Other chronic pain: Secondary | ICD-10-CM

## 2024-05-04 NOTE — Patient Instructions (Signed)
  Medication Instructions:  Your physician recommends that you continue on your current medications as directed. Please refer to the Current Medication list given to you today. --If you need a refill on any your medications before your next appointment, please call your pharmacy first. If no refills are authorized on file call the office.--   Follow-Up: Your next appointment:   Your physician recommends that you schedule a follow-up appointment in: 3 months follow up  with Dr. de [place]  You will receive a text message or e-mail with a link to a survey about your care and experience with [place] today! We would greatly appreciate your feedback!   Thanks for letting us be apart of your health journey!!  Primary Care and Sports Medicine   Dr. Ceasar Mons Peru   We encourage you to activate your patient portal called "MyChart".  Sign up information is provided on this After Visit Summary.  MyChart is used to connect with patients for Virtual Visits (Telemedicine).  Patients are able to view lab/test results, encounter notes, upcoming appointments, etc.  Non-urgent messages can be sent to your provider as well. To learn more about what you can do with MyChart, please visit --  [email or web address].

## 2024-05-04 NOTE — Assessment & Plan Note (Signed)
 Left knee/leg pain for about 1 year, worsening, some swelling through the leg. No prior injury. There was medication change made with amlodipine switched to alternative BP med, no improvement. She did have vascular US  completed without significant abnormality noted.  On exam, patient is in no acute distress.  Left knee with possible mild effusion.  She does have some joint line tenderness, particularly along the medial joint line, no tenderness to palpation about the patella.  She does also have increased tenderness to palpation over Pez anserine area and this does appear to be her primary focus of pain.  Normal range of motion on exam, although patient does have some guarding.  Negative varus and valgus stress testing.  She does have pain along medial aspect of knee with McMurray. 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  Discussed that there is also possibility of underlying osteoarthritis, particularly affecting medial compartment, also possibility of medial meniscal issue, however not strongly indicated based on history and while McMurray did produce pain, there is no click and this could still be related to pes anserine bursitis.  We discussed treatment options, she would like to proceed with steroid injection today which I feel is reasonable, discussed potential risks and benefits.  See procedure note above. Will also proceed with knee x-ray for further evaluation. Will plan for follow-up in about 3 months or sooner as needed for as indicated by results of imaging.  We discussed considerations of osteoarthritis is observed and patient is still having symptoms which may be more attributed to arthritis.  These include knee joint injection, physical therapy, OTC medications.

## 2024-05-04 NOTE — Progress Notes (Signed)
    Procedures performed today:    Procedure: Injection of left pes anserine bursa Verbal informed consent obtained.  Time-out conducted.  Noted no overlying erythema, induration, or other signs of local infection.  Skin prepped in a sterile fashion.  Local anesthesia: Topical Ethyl chloride.  With sterile technique: 1 cc Kenalog 40, 1 cc lidocaine 1% without epinephrine injected easily Completed without difficulty  Advised to call if fevers/chills, erythema, induration, drainage, or persistent bleeding.  Impression: Technically successful injection.  Independent interpretation of notes and tests performed by another provider:   None.  Brief History, Exam, Impression, and Recommendations:    BP 136/88 (BP Location: Right Arm, Patient Position: Sitting, Cuff Size: Normal)   Pulse 85   Ht 5' 3 (1.6 m)   Wt 172 lb (78 kg)   SpO2 97%   BMI 30.47 kg/m   Chronic pain of left knee Assessment & Plan: Left knee/leg pain for about 1 year, worsening, some swelling through the leg. No prior injury. There was medication change made with amlodipine switched to alternative BP med, no improvement. She did have vascular US  completed without significant abnormality noted.  On exam, patient is in no acute distress.  Left knee with possible mild effusion.  She does have some joint line tenderness, particularly along the medial joint line, no tenderness to palpation about the patella.  She does also have increased tenderness to palpation over Pez anserine area and this does appear to be her primary focus of pain.  Normal range of motion on exam, although patient does have some guarding.  Negative varus and valgus stress testing.  She does have pain along medial aspect of knee with McMurray. 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  Discussed that there is also possibility of underlying osteoarthritis, particularly  affecting medial compartment, also possibility of medial meniscal issue, however not strongly indicated based on history and while McMurray did produce pain, there is no click and this could still be related to pes anserine bursitis.  We discussed treatment options, she would like to proceed with steroid injection today which I feel is reasonable, discussed potential risks and benefits.  See procedure note above. Will also proceed with knee x-ray for further evaluation. Will plan for follow-up in about 3 months or sooner as needed for as indicated by results of imaging.  We discussed considerations of osteoarthritis is observed and patient is still having symptoms which may be more attributed to arthritis.  These include knee joint injection, physical therapy, OTC medications.  Orders: -     DG Knee Complete 4 Views Left; Future  Return in about 3 months (around 08/03/2024).   ___________________________________________ Maecy Podgurski de [place], MD, ABFM, CAQSM Primary Care and Sports Medicine [facility]

## 2024-05-18 ENCOUNTER — Ambulatory Visit (HOSPITAL_BASED_OUTPATIENT_CLINIC_OR_DEPARTMENT_OTHER): Payer: Self-pay | Admitting: Family Medicine

## 2024-06-23 ENCOUNTER — Telehealth

## 2024-06-27 ENCOUNTER — Encounter (HOSPITAL_COMMUNITY): Payer: Self-pay

## 2024-06-27 ENCOUNTER — Emergency Department (HOSPITAL_COMMUNITY)
Admission: EM | Admit: 2024-06-27 | Discharge: 2024-06-27 | Disposition: A | Discharge status: Home / Self Care | Attending: Emergency Medicine | Admitting: Emergency Medicine

## 2024-06-27 ENCOUNTER — Other Ambulatory Visit: Payer: Self-pay

## 2024-06-27 ENCOUNTER — Emergency Department (HOSPITAL_COMMUNITY)

## 2024-06-27 DIAGNOSIS — I160 Hypertensive urgency: Secondary | ICD-10-CM | POA: Insufficient documentation

## 2024-06-27 DIAGNOSIS — I10 Essential (primary) hypertension: Secondary | ICD-10-CM | POA: Insufficient documentation

## 2024-06-27 DIAGNOSIS — Z79899 Other long term (current) drug therapy: Secondary | ICD-10-CM | POA: Insufficient documentation

## 2024-06-27 DIAGNOSIS — E119 Type 2 diabetes mellitus without complications: Secondary | ICD-10-CM | POA: Insufficient documentation

## 2024-06-27 DIAGNOSIS — R519 Headache, unspecified: Secondary | ICD-10-CM | POA: Diagnosis present

## 2024-06-27 LAB — CBC
HCT: 44.4 % (ref 36.0–46.0)
Hemoglobin: 14.6 g/dL (ref 12.0–15.0)
MCH: 29.1 pg (ref 26.0–34.0)
MCHC: 32.9 g/dL (ref 30.0–36.0)
MCV: 88.6 fL (ref 80.0–100.0)
Platelets: 260 K/uL (ref 150–400)
RBC: 5.01 MIL/uL (ref 3.87–5.11)
RDW: 13.6 % (ref 11.5–15.5)
WBC: 7.9 K/uL (ref 4.0–10.5)
nRBC: 0 % (ref 0.0–0.2)

## 2024-06-27 LAB — BASIC METABOLIC PANEL WITH GFR
Anion gap: 11 (ref 5–15)
BUN: 10 mg/dL (ref 8–23)
CO2: 24 mmol/L (ref 22–32)
Calcium: 9.3 mg/dL (ref 8.9–10.3)
Chloride: 103 mmol/L (ref 98–111)
Creatinine, Ser: 0.58 mg/dL (ref 0.44–1.00)
GFR, Estimated: >60 mL/min (ref >60)
Glucose, Bld: 131 mg/dL — ABNORMAL HIGH (ref 70–99)
Potassium: 3.7 mmol/L (ref 3.5–5.1)
Sodium: 138 mmol/L (ref 135–145)

## 2024-06-27 MED ORDER — DIPHENHYDRAMINE HCL 25 MG PO CAPS
25.0000 mg | ORAL_CAPSULE | Freq: Once | ORAL | Status: AC
  Administered 2024-06-27: 25 mg via ORAL
  Filled 2024-06-27: qty 1

## 2024-06-27 MED ORDER — METOCLOPRAMIDE HCL 10 MG PO TABS
10.0000 mg | ORAL_TABLET | Freq: Once | ORAL | Status: AC
  Administered 2024-06-27: 10 mg via ORAL
  Filled 2024-06-27: qty 1

## 2024-06-27 NOTE — ED Triage Notes (Signed)
 Pt came in POV from the dentist where she says they took her BP three times. The last reading was 211/117 so she came in to get checked out. Denies  Nausea, vomiting, and dizziness. Complains of intense headache at 10/10 pain and breathing differences as well as being jittery.

## 2024-06-27 NOTE — ED Triage Notes (Signed)
 Pt  here from dentist office with c/o hypertension, bp  211/117.

## 2024-06-27 NOTE — ED Provider Triage Note (Signed)
 Emergency Medicine Provider Triage Evaluation Note  Monica Velazquez , a 63 y.o. female  was evaluated in triage.  Pt complains of headache and high BP.  Pt was at dentist office for extraction and was told BP was high then developed headache. Has had some headaches the last two days. No nausea or vomiting.   irbesartan -hydrochlorothiazide  (AVALIDE) 150-12.5 MG tablet Take 1 tablet by mouth daily.   Took it today  Review of Systems  Positive: headache Negative: Fever   Physical Exam  BP (!) 185/98 (BP Location: Left Arm)   Pulse 83   Temp 98.6 F (37 C) (Oral)   Resp 17   Ht 5' 3 (1.6 m)   Wt 77.6 kg   SpO2 98%   BMI 30.29 kg/m  Gen:   Awake, no distress   Resp:  Normal effort  MSK:   Moves extremities without difficulty Other:  Normal neuro exam  Medical Decision Making  Medically screening exam initiated at 12:22 PM.  Appropriate orders placed.  Monica Velazquez was informed that the remainder of the evaluation will be completed by another provider, this initial triage assessment does not replace that evaluation, and the importance of remaining in the ED until their evaluation is complete.  Labs ct head   Neldon Hamp RAMAN, [place] 06/27/24 1224

## 2024-06-27 NOTE — ED Provider Notes (Signed)
 [place] EMERGENCY DEPARTMENT AT [facility] Provider Note   CSN: [id number] Arrival date & time: 06/27/24  1104     Patient presents with: Hypertension   Monica Velazquez is a 63 y.o. female.With a history of hypertension type 2 diabetes and iron deficiency anemia who presents to the ED for hypertension.  Patient was at the dentist office earlier this morning when her blood pressure was repeatedly high and she was sent here for further evaluation.  Denies chest pain shortness of breath nausea vomiting dizziness but does have significant headache.  Headache was initially 10 out of 10 in severity localized over the frontal region.  She has had similar headaches with associated hypertension in the past.  No changes in vision, paresthesias or focal weakness.  Patient reports compliance with her Avalide daily.  Recently switched from lisinopril  to Avalide about a month ago from her PCP.   Hypertension       Prior to Admission medications   Medication Sig Start Date End Date Taking? Authorizing Provider  BIOTIN PO Take by mouth.    [provider]  irbesartan -hydrochlorothiazide  (AVALIDE) 150-12.5 MG tablet Take 1 tablet by mouth daily. 04/19/24   Knute Thersia Bitters, FNP  levothyroxine  (SYNTHROID ) 100 MCG tablet Take 1 tablet (100 mcg total) by mouth every morning. 04/19/24   Caudle, Thersia Bitters, FNP    Allergies: Patient has no known allergies.    Review of Systems  Updated Vital Signs BP (!) 172/88 (BP Location: Left Arm)   Pulse 70   Temp 97.8 F (36.6 C) (Oral)   Resp 18   Ht 5' 3 (1.6 m)   Wt 77.6 kg   SpO2 100%   BMI 30.29 kg/m   Physical Exam Vitals and nursing note reviewed.  HENT:     Head: Normocephalic and atraumatic.  Eyes:     Pupils: Pupils are equal, round, and reactive to light.  Cardiovascular:     Rate and Rhythm: Normal rate and regular rhythm.     Pulses: Normal pulses.     Comments: Symmetric radial and DP pulses  bilaterally Pulmonary:     Effort: Pulmonary effort is normal.     Breath sounds: Normal breath sounds.  Abdominal:     Palpations: Abdomen is soft.     Tenderness: There is no abdominal tenderness.  Skin:    General: Skin is warm and dry.  Neurological:     General: No focal deficit present.     Mental Status: She is alert.     Sensory: No sensory deficit.     Motor: No weakness.  Psychiatric:        Mood and Affect: Mood normal.     (all labs ordered are listed, but only abnormal results are displayed) Labs Reviewed  BASIC METABOLIC PANEL WITH GFR - Abnormal; Notable for the following components:      Result Value   Glucose, Bld 131 (*)    All other components within normal limits  CBC    EKG: None  Radiology: CT Head Wo Contrast Result Date: 06/27/2024 EXAM: CT HEAD WITHOUT CONTRAST 06/27/2024 01:13:00 PM TECHNIQUE: CT of the head was performed without the administration of intravenous contrast. Automated exposure control, iterative reconstruction, and/or weight based adjustment of the mA/kV was utilized to reduce the radiation dose to as low as reasonably achievable. COMPARISON: None available. CLINICAL HISTORY: 63 year old female. Headache, new onset (Age >= 51y). FINDINGS: BRAIN AND VENTRICLES: No acute hemorrhage. No evidence  of acute infarct. No hydrocephalus. No extra-axial collection. No mass effect or midline shift. Normal brain volume for age. Scattered asymmetric cerebral white matter hypodensity, mostly subcortical, in the frontal lobes greater on the right, and moderate for age. Otherwise maintained gray white differentiation. Mild calcified atherosclerosis at the skull base. No suspicious intracranial vascular hyperdensity. ORBITS: No acute abnormality. SINUSES: Visible paranasal sinuses, middle ears and mastoids are clear. SOFT TISSUES AND SKULL: No acute soft tissue abnormality. No skull fracture. IMPRESSION: 1. No acute intracranial abnormality. 2. Moderate for  age nonspecific cerebral white matter changes, most commonly due to small vessel disease. Electronically signed by: Helayne Hurst MD 06/27/2024 01:41 PM EDT RP Workstation: [id number]     Procedures   Medications Ordered in the ED  metoCLOPramide (REGLAN) tablet 10 mg (10 mg Oral Given 06/27/24 1229)  diphenhydrAMINE (BENADRYL) capsule 25 mg (25 mg Oral Given 06/27/24 1229)    Clinical Course as of 06/27/24 1601  Tue Jun 27, 2024  1600 Symmetric blood pressures in both arms.  Systolic now in 170s down from 210s earlier today.  This represents an approximately 20% reduction in blood pressure.  No need for additional antihypertensive therapy at this time.  EKG without evidence of dysrhythmia or ischemic changes.  Patient reports feeling well with improvement in her headache.  Still no focal neurologic deficits.  Appropriate for discharge with close PCP follow-up and blood pressure monitoring at home.  Return precautions were discussed in detail. [MP]    Clinical Course User Index [MP] [name] LABOR, DO                                 Medical Decision Making 63 year old female with history as above presenting to ED from dentist office for hypertension.  Initial blood pressure in 210s systolic.  Repeatedly elevated.  Frontal headache 10 out of 10 in severity.  No chest pain shortness of breath.  Was evaluated in triage and treated with Reglan and Benadryl.  Headache has improved.  No focal neurologic deficits on my exam with symmetric pulses.  No evidence of endorgan damage on workup.  Will repeat blood pressure and EKG here.  So long as she remains neurologically intact and blood pressure has improved after resting here she would be appropriate for discharge with instruction for PCP follow-up.  Amount and/or Complexity of Data Reviewed Labs: ordered.        Final diagnoses:  Hypertensive urgency  Acute nonintractable headache, unspecified headache type    ED Discharge Orders      None          [name] LABOR, DO 06/27/24 1601

## 2024-06-27 NOTE — Discharge Instructions (Signed)
 You were seen in the emergency department for high blood pressure Your blood work EKG and CAT scan of your head all looked okay There is no evidence of stroke Your blood pressure improved after resting here Please record your blood pressures at home and log them to discuss with your PCP Follow-up with your PCP within 1 to 2 weeks for reevaluation Continue taking all previous pick up medications including your Avalide as directed Return to emergency room for severe headaches, weakness in one-sided body, changes in vision, chest pain, shortness of breath or any other concerns

## 2024-06-29 ENCOUNTER — Encounter (HOSPITAL_BASED_OUTPATIENT_CLINIC_OR_DEPARTMENT_OTHER): Payer: Self-pay | Admitting: Family Medicine

## 2024-06-29 ENCOUNTER — Ambulatory Visit (INDEPENDENT_AMBULATORY_CARE_PROVIDER_SITE_OTHER): Admitting: Family Medicine

## 2024-06-29 VITALS — BP 168/92 | HR 85 | Ht 63.0 in | Wt 169.3 lb

## 2024-06-29 DIAGNOSIS — I10 Essential (primary) hypertension: Secondary | ICD-10-CM | POA: Diagnosis not present

## 2024-06-29 DIAGNOSIS — L249 Irritant contact dermatitis, unspecified cause: Secondary | ICD-10-CM

## 2024-06-29 MED ORDER — TELMISARTAN-HCTZ 40-12.5 MG PO TABS: 1.0000 | ORAL_TABLET | Freq: Every day | ORAL | 3 refills | Status: DC

## 2024-06-29 MED ORDER — TRIAMCINOLONE ACETONIDE 0.1 % EX OINT
1.0000 | TOPICAL_OINTMENT | Freq: Two times a day (BID) | CUTANEOUS | 2 refills | Status: AC
Start: 2024-06-29 — End: ?

## 2024-06-29 NOTE — Patient Instructions (Signed)
 Stop Irbesartan -hydrochlorothiazide    Start Telmisartan-hydrochlorothiazide  40-12.5mg  daily.   Monitor blood pressure regularly.

## 2024-06-29 NOTE — Progress Notes (Signed)
 Acute Care Office Visit  Subjective:   Monica Velazquez [birth year] 06/29/2024  Chief Complaint  Patient presents with   Hypertension    Pt states she has been having problems with her BP for about 2 weeks now and also has been having problems with headaches due to it. Also states that she broke out in a rash and does not know if it is due to one of her medications she is currently taking.    HPI: Discussed the use of AI scribe software for clinical note transcription with the patient, who gave verbal consent to proceed.  History of Present Illness Monica Velazquez is a 63 year old female with hypertension who presents with elevated blood pressure and rash.  She has experienced elevated blood pressure, initially noted last week at home as 180/88 mmHg. She managed it by drinking water and resting. Two days ago, her blood pressure rose to 211 mmHg, prompting an ER visit. An EKG and lab work were performed during her ER visit. She was previously switched from amlodipine to irbesartan  HCTZ due to swelling associated with amlodipine. Her blood pressure was not as high prior to this change. She is currently taking irbesartan  HCTZ, which she associates with increased urination. She does not mind the diuretic effect as it helps with fluid release.   RASH:  She has a rash that began about three weeks ago, characterized by hives and severe itching. She initially attributed it to a new robe or dill pickles she consumed, both of which she discontinued. She has been using cortisone and Benadryl for relief. The rash is improving but still causes itching. No new pets, recent travel, or changes in detergents or soaps.      The following portions of the patient's history were reviewed and updated as appropriate: past medical history, past surgical history, family history, social history, allergies, medications, and problem list.   Patient Active Problem List   Diagnosis Date Noted   Knee pain,  left 05/04/2024   Localized swelling of left lower leg 04/19/2024   Microalbuminuria due to type 2 diabetes mellitus (HCC) 02/02/2024   Type 2 diabetes mellitus with chronic kidney disease, without long-term current use of insulin (HCC) 02/02/2024   Seborrheic keratosis 11/24/2016   Benign essential HTN    Hypothyroid    Iron deficiency anemia    Past Medical History:  Diagnosis Date   Bilateral impacted cerumen 03/10/2019   Epistaxis, recurrent 03/10/2019   HTN (hypertension)    Hypothyroid    Iron deficiency anemia    Menometrorrhagia    Uterine fibroid    Past Surgical History:  Procedure Laterality Date   THYROIDECTOMY, PARTIAL Right 02/13/2004   Family History  Problem Relation Age of Onset   Hypertension Mother    Breast cancer Mother 64   Stroke Father    Hypertension Sister    Sickle cell trait Sister    Hypertension Sister    Stroke Brother    Hypertension Brother    Diabetes Brother    Hypertension Maternal Grandfather    Sickle cell anemia Other    Outpatient Medications Prior to Visit  Medication Sig Dispense Refill   BIOTIN PO Take by mouth.     Cyanocobalamin (VITAMIN B12 PO) Take by mouth.     levothyroxine  (SYNTHROID ) 100 MCG tablet Take 1 tablet (100 mcg total) by mouth every morning. 90 tablet 3   Multiple Vitamins-Minerals (CENTRUM SILVER ULTRA WOMENS PO) Take by mouth.  irbesartan -hydrochlorothiazide  (AVALIDE) 150-12.5 MG tablet Take 1 tablet by mouth daily. 30 tablet 3   No facility-administered medications prior to visit.   No Known Allergies   ROS: A complete ROS was performed with pertinent positives/negatives noted in the HPI. The remainder of the ROS are negative.    Objective:   Today's Vitals   06/29/24 1558 06/29/24 1625  BP: (!) 172/88 (!) 168/92  Pulse: 85   SpO2: 98%   Weight: 169 lb 4.8 oz (76.8 kg)   Height: 5' 3 (1.6 m)     GENERAL: Well-appearing, in NAD. Well nourished.  SKIN: Pink, warm and dry. Contact  dermatitis rash with redness and macular areas present to bilateral upper arms.  Head: Normocephalic. NECK: Trachea midline. Full ROM w/o pain or tenderness.  RESPIRATORY: Chest wall symmetrical. Respirations even and non-labored. Breath sounds clear to auscultation bilaterally.  CARDIAC: S1, S2 present, regular rate and rhythm without murmur or gallops. Peripheral pulses 2+ bilaterally.  MSK: Muscle tone and strength appropriate for age.  EXTREMITIES: Without clubbing, cyanosis, or edema.  NEUROLOGIC: No motor or sensory deficits. Steady, even gait. C2-C12 intact.  PSYCH/MENTAL STATUS: Alert, oriented x 3. Cooperative, appropriate mood and affect.      Assessment & Plan:  1. Benign essential HTN (Primary) Uncontrolled currently. Will stop irbesartan -hydrochlorothiazide  and start Telmisartan-hydrochlorothiazide  40-12.5mg  given longer half life with goal of improving BP control. Will return in 2 weeks for BP check and BMP. She will continue to monitor at home regularly.  - telmisartan-hydrochlorothiazide  (MICARDIS HCT) 40-12.5 MG tablet; Take 1 tablet by mouth daily.  Dispense: 30 tablet; Refill: 3  2. Irritant contact dermatitis, unspecified trigger Discussed identifying trigger at home. Will use Kenalog ointment BID to affected areas. Follow up in 1-2 weeks if no improvement.  - triamcinolone  ointment (KENALOG) 0.1 %; Apply 1 Application topically 2 (two) times daily. To affected areas  Dispense: 60 g; Refill: 2   Meds ordered this encounter  Medications   telmisartan-hydrochlorothiazide  (MICARDIS HCT) 40-12.5 MG tablet    Sig: Take 1 tablet by mouth daily.    Dispense:  30 tablet    Refill:  3    Supervising Provider:   DE CUBA, RAYMOND J [[id number]]   triamcinolone  ointment (KENALOG) 0.1 %    Sig: Apply 1 Application topically 2 (two) times daily. To affected areas    Dispense:  60 g    Refill:  2    Supervising Provider:   DE CUBA, RAYMOND J [[id number]]   Lab Orders  No laboratory  test(s) ordered today   No images are attached to the encounter or orders placed in the encounter.  Return for 2 weeks BP Check and BMP lab only .    Patient to reach out to office if new, worrisome, or unresolved symptoms arise or if no improvement in patient's condition. Patient verbalized understanding and is agreeable to treatment plan. All questions answered to patient's satisfaction.    Thersia Schuyler Stark, [place]

## 2024-07-13 ENCOUNTER — Ambulatory Visit (HOSPITAL_BASED_OUTPATIENT_CLINIC_OR_DEPARTMENT_OTHER)

## 2024-07-21 ENCOUNTER — Ambulatory Visit (INDEPENDENT_AMBULATORY_CARE_PROVIDER_SITE_OTHER): Admitting: *Deleted

## 2024-07-21 ENCOUNTER — Other Ambulatory Visit (HOSPITAL_BASED_OUTPATIENT_CLINIC_OR_DEPARTMENT_OTHER): Payer: Self-pay | Admitting: Family Medicine

## 2024-07-21 VITALS — BP 166/97

## 2024-07-21 DIAGNOSIS — I10 Essential (primary) hypertension: Secondary | ICD-10-CM

## 2024-07-21 LAB — BASIC METABOLIC PANEL WITH GFR
BUN/Creatinine Ratio: 17 (ref 12–28)
BUN: 11 mg/dL (ref 8–27)
CO2: 26 mmol/L (ref 20–29)
Calcium: 9.9 mg/dL (ref 8.7–10.3)
Chloride: 102 mmol/L (ref 96–106)
Creatinine, Ser: 0.66 mg/dL (ref 0.57–1.00)
Glucose: 113 mg/dL — ABNORMAL HIGH (ref 70–99)
Potassium: 4.4 mmol/L (ref 3.5–5.2)
Sodium: 143 mmol/L (ref 134–144)
eGFR: 99 mL/min/1.73 (ref >59)

## 2024-07-21 MED ORDER — TELMISARTAN-HCTZ 80-25 MG PO TABS: 1.0000 | ORAL_TABLET | Freq: Every day | ORAL | 3 refills | Status: AC

## 2024-07-21 NOTE — Progress Notes (Signed)
 Pt came in today for repeat BP and labs. BP checked twice and both readings have been documented. Pt brought readings from home for comparison and readings were shown to [employer].

## 2024-07-24 ENCOUNTER — Ambulatory Visit (HOSPITAL_BASED_OUTPATIENT_CLINIC_OR_DEPARTMENT_OTHER): Payer: Self-pay | Admitting: Family Medicine

## 2024-07-24 NOTE — Progress Notes (Signed)
BMP stable

## 2024-08-08 ENCOUNTER — Ambulatory Visit (INDEPENDENT_AMBULATORY_CARE_PROVIDER_SITE_OTHER): Admitting: Family Medicine

## 2024-08-08 ENCOUNTER — Encounter (HOSPITAL_BASED_OUTPATIENT_CLINIC_OR_DEPARTMENT_OTHER): Payer: Self-pay | Admitting: Family Medicine

## 2024-08-08 VITALS — BP 159/89 | HR 82 | Ht 63.0 in | Wt 171.0 lb

## 2024-08-08 DIAGNOSIS — M545 Low back pain, unspecified: Secondary | ICD-10-CM

## 2024-08-08 DIAGNOSIS — Z23 Encounter for immunization: Secondary | ICD-10-CM

## 2024-08-08 DIAGNOSIS — E039 Hypothyroidism, unspecified: Secondary | ICD-10-CM

## 2024-08-08 DIAGNOSIS — H9201 Otalgia, right ear: Secondary | ICD-10-CM

## 2024-08-08 DIAGNOSIS — I10 Essential (primary) hypertension: Secondary | ICD-10-CM

## 2024-08-08 DIAGNOSIS — H6121 Impacted cerumen, right ear: Secondary | ICD-10-CM

## 2024-08-08 DIAGNOSIS — N189 Chronic kidney disease, unspecified: Secondary | ICD-10-CM

## 2024-08-08 DIAGNOSIS — E1129 Type 2 diabetes mellitus with other diabetic kidney complication: Secondary | ICD-10-CM

## 2024-08-08 NOTE — Progress Notes (Unsigned)
 Subjective:   Monica Velazquez 10-Aug-1961 08/08/2024  Chief Complaint  Patient presents with   Medical Management of Chronic Issues    56-month follow up; brought a list of what her BP has been when she has checked it at home. States she has been having lower back pain and wants to have her right ear looked at.    Discussed the use of AI scribe software for clinical note transcription with the patient, who gave verbal consent to proceed.  History of Present Illness    HTN DM- Microalbumin  BMP  HPI: Monica Velazquez presents today for re-assessment and management of chronic medical conditions.   The following portions of the patient's history were reviewed and updated as appropriate: past medical history, past surgical history, family history, social history, allergies, medications, and problem list.   Patient Active Problem List   Diagnosis Date Noted   Knee pain, left 05/04/2024   Localized swelling of left lower leg 04/19/2024   Microalbuminuria due to type 2 diabetes mellitus (HCC) 02/02/2024   Type 2 diabetes mellitus with chronic kidney disease, without long-term current use of insulin (HCC) 02/02/2024   Seborrheic keratosis 11/24/2016   Benign essential HTN    Hypothyroid    Iron deficiency anemia    Past Medical History:  Diagnosis Date   Bilateral impacted cerumen 03/10/2019   Epistaxis, recurrent 03/10/2019   HTN (hypertension)    Hypothyroid    Iron deficiency anemia    Menometrorrhagia    Uterine fibroid    Past Surgical History:  Procedure Laterality Date   THYROIDECTOMY, PARTIAL Right 02/13/2004   Family History  Problem Relation Age of Onset   Hypertension Mother    Breast cancer Mother 45   Stroke Father    Hypertension Sister    Sickle cell trait Sister    Hypertension Sister    Stroke Brother    Hypertension Brother    Diabetes Brother    Hypertension Maternal Grandfather    Sickle cell anemia Other    Outpatient Medications Prior  to Visit  Medication Sig Dispense Refill   BIOTIN PO Take by mouth.     Cyanocobalamin (VITAMIN B12 PO) Take by mouth.     levothyroxine  (SYNTHROID ) 100 MCG tablet Take 1 tablet (100 mcg total) by mouth every morning. 90 tablet 3   Multiple Vitamins-Minerals (CENTRUM SILVER ULTRA WOMENS PO) Take by mouth.     telmisartan -hydrochlorothiazide  (MICARDIS  HCT) 80-25 MG tablet Take 1 tablet by mouth daily. 30 tablet 3   triamcinolone  ointment (KENALOG ) 0.1 % Apply 1 Application topically 2 (two) times daily. To affected areas 60 g 2   No facility-administered medications prior to visit.   No Known Allergies   ROS: A complete ROS was performed with pertinent positives/negatives noted in the HPI. The remainder of the ROS are negative.    Objective:   Today's Vitals   08/08/24 1529  BP: (!) 159/89  Pulse: 82  SpO2: 96%  Weight: 171 lb (77.6 kg)  Height: 5' 3 (1.6 m)    Physical Exam   GENERAL: Well-appearing, in NAD. Well nourished.  SKIN: Pink, warm and dry. No rash, lesion, ulceration, or ecchymoses.  Head: Normocephalic. NECK: Trachea midline. Full ROM w/o pain or tenderness. No lymphadenopathy.  EARS: Tympanic membranes are intact, translucent without bulging and without drainage. Appropriate landmarks visualized.  EYES: Conjunctiva clear without exudates. EOMI, PERRL, no drainage present.  NOSE: Septum midline w/o deformity. Nares patent, mucosa pink and  non-inflamed w/o drainage. No sinus tenderness.  THROAT: Uvula midline. Oropharynx clear. Tonsils non-inflamed without exudate. Mucous membranes pink and moist.  RESPIRATORY: Chest wall symmetrical. Respirations even and non-labored. Breath sounds clear to auscultation bilaterally.  CARDIAC: S1, S2 present, regular rate and rhythm without murmur or gallops. Peripheral pulses 2+ bilaterally.  MSK: Muscle tone and strength appropriate for age. Joints w/o tenderness, redness, or swelling.  EXTREMITIES: Without clubbing, cyanosis,  or edema.  NEUROLOGIC: No motor or sensory deficits. Steady, even gait. C2-C12 intact.  PSYCH/MENTAL STATUS: Alert, oriented x 3. Cooperative, appropriate mood and affect.   Health Maintenance Due  Topic Date Due   FOOT EXAM  Never done   OPHTHALMOLOGY EXAM  Never done   HIV Screening  Never done   Hepatitis C Screening  Never done   Pneumococcal Vaccine: 50+ Years (1 of 2 - PCV) Never done   Zoster Vaccines- Shingrix (1 of 2) Never done   COVID-19 Vaccine (3 - Moderna risk series) 09/19/2020   Bone Density Scan  10/12/2021   DTaP/Tdap/Td (2 - Td or Tdap) 04/05/2022   Mammogram  02/08/2024   Cervical Cancer Screening (Pap smear)  02/10/2024   Influenza Vaccine  03/31/2024   HEMOGLOBIN A1C  07/21/2024    No results found for any visits on 08/08/24.  The 10-year ASCVD risk score (Arnett DK, et al., 2019) is: 24.1%     Assessment & Plan:  *** There are no diagnoses linked to this encounter.  No orders of the defined types were placed in this encounter.  Lab Orders  No laboratory test(s) ordered today   No images are attached to the encounter or orders placed in the encounter.  No follow-ups on file.    Patient to reach out to office if new, worrisome, or unresolved symptoms arise or if no improvement in patient's condition. Patient verbalized understanding and is agreeable to treatment plan. All questions answered to patient's satisfaction.    Thersia Schuyler Stark, [place]

## 2024-08-08 NOTE — Patient Instructions (Addendum)
 [facility]   Dr. Delon Lenis  Address: [address], [place], [place] [address] Phone: [phone]  Ophthalmology Offices   [facility]  [address]  [place], [place] [address] [phone]  [facility] [address] [place], [place] [address] Phone: [phone]  [facility] [address] [place], [place] [address] Phone: [phone]  [facility]  Optometrist [address]  [phone]  [facility] Optometrist [address]  [phone]

## 2024-08-09 LAB — BASIC METABOLIC PANEL WITH GFR
BUN/Creatinine Ratio: 24 (ref 12–28)
BUN: 16 mg/dL (ref 8–27)
CO2: 21 mmol/L (ref 20–29)
Calcium: 10.1 mg/dL (ref 8.7–10.3)
Chloride: 99 mmol/L (ref 96–106)
Creatinine, Ser: 0.66 mg/dL (ref 0.57–1.00)
Glucose: 105 mg/dL — ABNORMAL HIGH (ref 70–99)
Potassium: 4.1 mmol/L (ref 3.5–5.2)
Sodium: 138 mmol/L (ref 134–144)
eGFR: 99 mL/min/1.73 (ref >59)

## 2024-08-09 LAB — MICROALBUMIN / CREATININE URINE RATIO
Creatinine, Urine: 83.6 mg/dL
Microalb/Creat Ratio: 22 mg/g{creat} (ref 0–29)
Microalbumin, Urine: 18.2 ug/mL

## 2024-08-09 LAB — HEMOGLOBIN A1C
Est. average glucose Bld gHb Est-mCnc: 148 mg/dL
Hgb A1c MFr Bld: 6.8 % — ABNORMAL HIGH (ref 4.8–5.6)

## 2024-08-09 LAB — TSH: TSH: 1.49 u[IU]/mL (ref 0.450–4.500)

## 2024-08-10 ENCOUNTER — Ambulatory Visit (HOSPITAL_BASED_OUTPATIENT_CLINIC_OR_DEPARTMENT_OTHER): Payer: Self-pay | Admitting: Family Medicine

## 2024-08-10 NOTE — Progress Notes (Signed)
 Hi Monica Velazquez, Your kidney function is stable and there is no sign of increased protein present to your urine.  It has actually improved from check last year and there is no need to go on Kerendia at this time.  Your thyroid  function is stable.  Your A1c did increase slightly to 6.8.  This is still considered controlled under 7.0 for patients with diabetes.  Please continue to make good dietary changes and regular exercise and we will continue to monitor with repeat in 6 months.

## 2024-08-22 ENCOUNTER — Ambulatory Visit (HOSPITAL_BASED_OUTPATIENT_CLINIC_OR_DEPARTMENT_OTHER)

## 2024-08-22 VITALS — BP 146/74

## 2024-08-22 DIAGNOSIS — Z0130 Encounter for examination of blood pressure without abnormal findings: Secondary | ICD-10-CM

## 2025-02-06 ENCOUNTER — Encounter (HOSPITAL_BASED_OUTPATIENT_CLINIC_OR_DEPARTMENT_OTHER): Admitting: Family Medicine
